# Patient Record
Sex: Female | Born: 1955 | Race: White | Hispanic: No | Marital: Married | Smoking: Current every day smoker
Health system: Southern US, Community
[De-identification: ages and names within clinical notes are randomized; demographics above are authoritative.]

## PROBLEM LIST (undated history)

## (undated) DIAGNOSIS — I10 Essential (primary) hypertension: Secondary | ICD-10-CM

## (undated) DIAGNOSIS — Z862 Personal history of diseases of the blood and blood-forming organs and certain disorders involving the immune mechanism: Secondary | ICD-10-CM

## (undated) DIAGNOSIS — I219 Acute myocardial infarction, unspecified: Secondary | ICD-10-CM

## (undated) DIAGNOSIS — F410 Panic disorder [episodic paroxysmal anxiety] without agoraphobia: Secondary | ICD-10-CM

## (undated) DIAGNOSIS — G43909 Migraine, unspecified, not intractable, without status migrainosus: Secondary | ICD-10-CM

## (undated) DIAGNOSIS — I2510 Atherosclerotic heart disease of native coronary artery without angina pectoris: Secondary | ICD-10-CM

## (undated) DIAGNOSIS — Z8709 Personal history of other diseases of the respiratory system: Secondary | ICD-10-CM

## (undated) DIAGNOSIS — E785 Hyperlipidemia, unspecified: Secondary | ICD-10-CM

## (undated) DIAGNOSIS — J449 Chronic obstructive pulmonary disease, unspecified: Secondary | ICD-10-CM

## (undated) DIAGNOSIS — M1990 Unspecified osteoarthritis, unspecified site: Secondary | ICD-10-CM

## (undated) HISTORY — PX: COLONOSCOPY: SHX174

## (undated) HISTORY — DX: Acute myocardial infarction, unspecified: I21.9

## (undated) HISTORY — PX: CARDIAC CATHETERIZATION: SHX172

## (undated) HISTORY — DX: Panic disorder (episodic paroxysmal anxiety): F41.0

## (undated) HISTORY — DX: Hyperlipidemia, unspecified: E78.5

## (undated) HISTORY — PX: HAND SURGERY: SHX662

## (undated) HISTORY — DX: Migraine, unspecified, not intractable, without status migrainosus: G43.909

## (undated) HISTORY — DX: Atherosclerotic heart disease of native coronary artery without angina pectoris: I25.10

## (undated) HISTORY — DX: Essential (primary) hypertension: I10

---

## 2001-09-17 ENCOUNTER — Ambulatory Visit (HOSPITAL_COMMUNITY): Admission: RE | Admit: 2001-09-17 | Discharge: 2001-09-17 | Payer: Self-pay | Admitting: Family Medicine

## 2001-09-17 ENCOUNTER — Encounter: Payer: Self-pay | Admitting: Family Medicine

## 2003-01-25 DIAGNOSIS — I219 Acute myocardial infarction, unspecified: Secondary | ICD-10-CM

## 2003-01-25 HISTORY — PX: CORONARY ARTERY BYPASS GRAFT: SHX141

## 2003-01-25 HISTORY — DX: Acute myocardial infarction, unspecified: I21.9

## 2003-11-20 ENCOUNTER — Ambulatory Visit: Payer: Self-pay | Admitting: Psychiatry

## 2003-11-29 ENCOUNTER — Ambulatory Visit: Payer: Self-pay | Admitting: Cardiology

## 2003-11-29 ENCOUNTER — Inpatient Hospital Stay (HOSPITAL_COMMUNITY): Admission: EM | Admit: 2003-11-29 | Discharge: 2003-12-03 | Payer: Self-pay | Admitting: Cardiology

## 2003-12-01 ENCOUNTER — Encounter: Payer: Self-pay | Admitting: Cardiology

## 2003-12-17 ENCOUNTER — Ambulatory Visit: Payer: Self-pay | Admitting: *Deleted

## 2003-12-23 ENCOUNTER — Inpatient Hospital Stay (HOSPITAL_COMMUNITY): Admission: AD | Admit: 2003-12-23 | Discharge: 2004-01-03 | Payer: Self-pay | Admitting: Cardiology

## 2003-12-23 ENCOUNTER — Encounter: Payer: Self-pay | Admitting: Emergency Medicine

## 2003-12-23 ENCOUNTER — Ambulatory Visit: Payer: Self-pay | Admitting: Cardiology

## 2003-12-26 ENCOUNTER — Encounter: Payer: Self-pay | Admitting: Cardiology

## 2004-01-23 ENCOUNTER — Ambulatory Visit (HOSPITAL_COMMUNITY): Admission: RE | Admit: 2004-01-23 | Discharge: 2004-01-23 | Payer: Self-pay | Admitting: *Deleted

## 2004-01-23 ENCOUNTER — Ambulatory Visit: Payer: Self-pay | Admitting: *Deleted

## 2004-03-24 ENCOUNTER — Ambulatory Visit: Payer: Self-pay | Admitting: *Deleted

## 2004-12-14 ENCOUNTER — Ambulatory Visit: Payer: Self-pay | Admitting: Cardiology

## 2004-12-27 ENCOUNTER — Ambulatory Visit: Payer: Self-pay

## 2005-05-31 ENCOUNTER — Ambulatory Visit: Payer: Self-pay | Admitting: Cardiology

## 2005-06-02 ENCOUNTER — Ambulatory Visit: Payer: Self-pay

## 2005-07-18 ENCOUNTER — Ambulatory Visit: Payer: Self-pay | Admitting: Cardiology

## 2005-10-10 ENCOUNTER — Ambulatory Visit: Payer: Self-pay | Admitting: Cardiology

## 2006-05-16 ENCOUNTER — Ambulatory Visit: Payer: Self-pay | Admitting: Cardiology

## 2006-06-15 ENCOUNTER — Ambulatory Visit: Payer: Self-pay

## 2006-06-30 ENCOUNTER — Ambulatory Visit: Payer: Self-pay | Admitting: Cardiology

## 2006-10-04 ENCOUNTER — Ambulatory Visit: Payer: Self-pay | Admitting: Cardiovascular Disease

## 2008-03-13 ENCOUNTER — Ambulatory Visit: Payer: Self-pay | Admitting: Cardiology

## 2008-03-26 ENCOUNTER — Encounter (INDEPENDENT_AMBULATORY_CARE_PROVIDER_SITE_OTHER): Payer: Self-pay | Admitting: *Deleted

## 2008-03-26 LAB — CONVERTED CEMR LAB
ALT: 10 units/L
AST: 11 units/L
Albumin: 4.4 g/dL
Alkaline Phosphatase: 94 units/L
Bilirubin, Direct: 0.1 mg/dL
Cholesterol: 224 mg/dL
HDL: 63 mg/dL
LDL Cholesterol: 147 mg/dL
Total Protein: 6.7 g/dL
Triglycerides: 68 mg/dL

## 2008-04-03 ENCOUNTER — Encounter (HOSPITAL_COMMUNITY): Admission: RE | Admit: 2008-04-03 | Discharge: 2008-05-03 | Payer: Self-pay | Admitting: Cardiology

## 2008-04-03 ENCOUNTER — Ambulatory Visit: Payer: Self-pay | Admitting: Cardiology

## 2008-04-03 ENCOUNTER — Encounter (INDEPENDENT_AMBULATORY_CARE_PROVIDER_SITE_OTHER): Payer: Self-pay | Admitting: *Deleted

## 2008-04-03 LAB — CONVERTED CEMR LAB
ALT: 10 units/L
AST: 11 units/L
Albumin: 4.4 g/dL
Alkaline Phosphatase: 94 units/L
Bilirubin, Direct: 0.1 mg/dL
Cholesterol: 224 mg/dL
HDL: 63 mg/dL
LDL Cholesterol: 147 mg/dL
Total Protein: 6.7 g/dL
Triglycerides: 68 mg/dL

## 2008-04-04 ENCOUNTER — Ambulatory Visit: Payer: Self-pay | Admitting: Cardiology

## 2008-04-04 ENCOUNTER — Encounter (INDEPENDENT_AMBULATORY_CARE_PROVIDER_SITE_OTHER): Payer: Self-pay

## 2008-04-04 LAB — CONVERTED CEMR LAB
ALT: 10 units/L
AST: 11 units/L
Albumin: 4.4 g/dL
Alkaline Phosphatase: 94 units/L
Bilirubin, Direct: 0.1 mg/dL
Cholesterol: 224 mg/dL
HDL: 63 mg/dL
LDL Cholesterol: 147 mg/dL
Total Protein: 6.7 g/dL
Triglycerides: 68 mg/dL

## 2008-06-27 ENCOUNTER — Telehealth (INDEPENDENT_AMBULATORY_CARE_PROVIDER_SITE_OTHER): Payer: Self-pay | Admitting: *Deleted

## 2008-06-30 ENCOUNTER — Encounter (INDEPENDENT_AMBULATORY_CARE_PROVIDER_SITE_OTHER): Payer: Self-pay | Admitting: *Deleted

## 2008-08-11 ENCOUNTER — Encounter (INDEPENDENT_AMBULATORY_CARE_PROVIDER_SITE_OTHER): Payer: Self-pay | Admitting: *Deleted

## 2008-08-13 ENCOUNTER — Encounter (INDEPENDENT_AMBULATORY_CARE_PROVIDER_SITE_OTHER): Payer: Self-pay | Admitting: *Deleted

## 2008-11-17 ENCOUNTER — Ambulatory Visit: Payer: Self-pay | Admitting: Cardiology

## 2008-11-17 ENCOUNTER — Inpatient Hospital Stay (HOSPITAL_COMMUNITY): Admission: EM | Admit: 2008-11-17 | Discharge: 2008-11-19 | Payer: Self-pay | Admitting: Cardiology

## 2008-11-18 ENCOUNTER — Encounter: Payer: Self-pay | Admitting: Cardiology

## 2008-11-19 ENCOUNTER — Encounter (INDEPENDENT_AMBULATORY_CARE_PROVIDER_SITE_OTHER): Payer: Self-pay

## 2008-11-19 LAB — CONVERTED CEMR LAB
BUN: 8 mg/dL
CO2: 24 meq/L
Calcium: 8.4 mg/dL
Chloride: 107 meq/L
Creatinine, Ser: 0.52 mg/dL
GFR calc non Af Amer: >60 mL/min
Glomerular Filtration Rate, Af Am: >60 mL/min/{1.73_m2}
Glucose, Bld: 87 mg/dL
HCT: 36 %
Hemoglobin: 12.4 g/dL
MCV: 94.1 fL
Platelets: 213 10*3/uL
Potassium: 3.8 meq/L
Sodium: 137 meq/L
WBC: 6.8 10*3/uL

## 2008-11-20 ENCOUNTER — Encounter (INDEPENDENT_AMBULATORY_CARE_PROVIDER_SITE_OTHER): Payer: Self-pay | Admitting: *Deleted

## 2008-11-20 ENCOUNTER — Telehealth (INDEPENDENT_AMBULATORY_CARE_PROVIDER_SITE_OTHER): Payer: Self-pay | Admitting: *Deleted

## 2008-11-27 DIAGNOSIS — F17200 Nicotine dependence, unspecified, uncomplicated: Secondary | ICD-10-CM

## 2008-11-27 DIAGNOSIS — I10 Essential (primary) hypertension: Secondary | ICD-10-CM

## 2008-11-27 DIAGNOSIS — F410 Panic disorder [episodic paroxysmal anxiety] without agoraphobia: Secondary | ICD-10-CM | POA: Insufficient documentation

## 2008-11-27 DIAGNOSIS — M810 Age-related osteoporosis without current pathological fracture: Secondary | ICD-10-CM | POA: Insufficient documentation

## 2008-11-27 DIAGNOSIS — E782 Mixed hyperlipidemia: Secondary | ICD-10-CM | POA: Insufficient documentation

## 2008-12-02 ENCOUNTER — Ambulatory Visit: Payer: Self-pay | Admitting: Cardiology

## 2008-12-02 ENCOUNTER — Encounter (INDEPENDENT_AMBULATORY_CARE_PROVIDER_SITE_OTHER): Payer: Self-pay

## 2009-01-27 ENCOUNTER — Encounter (INDEPENDENT_AMBULATORY_CARE_PROVIDER_SITE_OTHER): Payer: Self-pay | Admitting: *Deleted

## 2009-02-11 ENCOUNTER — Encounter (HOSPITAL_COMMUNITY): Admission: RE | Admit: 2009-02-11 | Discharge: 2009-03-13 | Payer: Self-pay | Admitting: Preventative Medicine

## 2009-03-12 ENCOUNTER — Encounter: Payer: Self-pay | Admitting: Cardiology

## 2009-04-01 ENCOUNTER — Ambulatory Visit: Payer: Self-pay | Admitting: Cardiology

## 2009-04-01 DIAGNOSIS — I2510 Atherosclerotic heart disease of native coronary artery without angina pectoris: Secondary | ICD-10-CM

## 2009-07-01 ENCOUNTER — Ambulatory Visit: Payer: Self-pay | Admitting: Cardiology

## 2009-07-01 DIAGNOSIS — R059 Cough, unspecified: Secondary | ICD-10-CM | POA: Insufficient documentation

## 2009-07-01 DIAGNOSIS — R05 Cough: Secondary | ICD-10-CM | POA: Insufficient documentation

## 2009-12-11 ENCOUNTER — Telehealth: Payer: Self-pay | Admitting: Cardiology

## 2009-12-15 ENCOUNTER — Encounter: Payer: Self-pay | Admitting: Cardiology

## 2010-01-01 ENCOUNTER — Telehealth (INDEPENDENT_AMBULATORY_CARE_PROVIDER_SITE_OTHER): Payer: Self-pay | Admitting: *Deleted

## 2010-01-04 ENCOUNTER — Ambulatory Visit: Payer: Self-pay | Admitting: Cardiology

## 2010-01-27 ENCOUNTER — Ambulatory Visit: Admit: 2010-01-27 | Payer: Self-pay | Admitting: Cardiology

## 2010-02-14 ENCOUNTER — Encounter: Payer: Self-pay | Admitting: Cardiology

## 2010-02-23 NOTE — Progress Notes (Signed)
Summary: med issue  Phone Note Call from Patient   Caller: Patient Call For:  MEDS Summary of Call: [name] called and she needs her ramperil called in for 5 mg per the phone note from Dr.Mcdowell has a appt scheduled for 01/27/2009 also her toprol xl 50 mg daily called in [facility] Initial call taken by: Alexis Goodell,  January 01, 2010 3:22 PM    Prescriptions: TOPROL XL 50 MG XR24H-TAB (METOPROLOL SUCCINATE) take 1 tablet by mouth once daily  #30 x 3   Entered by:   Larita Fife Via LPN   Authorized by:   Loreli Slot, MD, [facility]   Signed by:   Larita Fife Via LPN on 04/54/0981   Method used:   Electronically to        [facility]* (retail)       [address]       [place], [place]  [address]       Ph: [phone]       Fax: [phone]   RxID:   [id number] RAMIPRIL 5 MG CAPS (RAMIPRIL) take 1 tablert by mouth once daily  #30 x 3   Entered by:   Larita Fife Via LPN   Authorized by:   Loreli Slot, MD, [facility]   Signed by:   Larita Fife Via LPN on 36/64/4034   Method used:   Electronically to        [facility]* (retail)       [address]       [place], [place]  [address]       Ph: [phone]       Fax: [phone]   RxID:   [id number]

## 2010-02-23 NOTE — Assessment & Plan Note (Signed)
Summary: f40m   Visit Type:  Follow-up Primary Provider:  Dr. Doreen Beam   History of Present Illness: 55 year old woman presents for followup. History is reviewed in Amanda prior note. [name] unfortunately did not present for her followup lab work as recommended. She reports compliance with her medications. She does continue to smoke cigarettes, although states that she is going to try to quit. She states she spoke with her therapist, and would like to start on Wellbutrin.  [name] denies any anginal chest pain. She has NYHA class II dyspnea on exertion. She does report a cough over Amanda last 2 weeks with "mucous" and also a sharp, pleuritic chest pain. She's had no hemoptysis, fevers, or chills. She has not followed up with her primary care physician as yet.  Clinical Review Panels:  Cardiac Imaging Cardiac Cath Findings        CONCLUSION:   1. Moderate reduction in Amanda left ventricular function with an       inferior wall motion abnormality, but ejection fraction in Amanda 45-       50% range.   2. No evidence of significant aortic regurgitation with an aortic root       shot demonstrating likely occlusion of Amanda diagonal graft.   3. Continued patency of Amanda saphenous vein graft to Amanda distal right       circulation with occlusion of Amanda sequential limb likely to a       posterolateral branch   4. Continued patency of a small-caliber internal mammary to Amanda distal       left anterior descending artery.   5. Continued patency of Amanda saphenous vein graft to Amanda distal       marginal.   6. Occlusion of Amanda saphenous vein graft to Amanda diagonal.      RECOMMENDATIONS:   1. Discontinuation of smoking will be highly recommended.   2. [name] will follow up with Dr. Diona Browner in [place].               Arturo Morton. Riley Kill, MD, [facility]   Electronically Signed            TDS/MEDQ  D:  11/18/2008  T:  11/19/2008  Job:  [id number]      cc:   Jonelle Sidle, MD   Doreen Beam, MD   Salvadore Farber, MD  (11/18/2008)    Preventive Screening-Counseling & Management  Alcohol-Tobacco     Smoking Status: current     Smoking Cessation Counseling: yes     Packs/Day: 1 PPD  Current Medications (verified): 1)  Ramipril 10 Mg Caps (Ramipril) .... Take 1 Tablet Daily 2)  Nitrolingual 0.4 Mg/spray Soln (Nitroglycerin) .... One Spray Under Tongue Every 5 Minutes As Needed For Chest Pain---May Repeat Times Three 3)  Plavix 75 Mg Tabs (Clopidogrel Bisulfate) .[name].. 1 Tablet By Mouth Once A Day 4)  Toprol Xl 50 Mg Xr24h-Tab (Metoprolol Succinate) .... Take 1 Tablet By Mouth Once Daily 5)  Crestor 40 Mg Tabs (Rosuvastatin Calcium) .... Take 1 Tab Daily 6)  Aspir-Low 81 Mg Tbec (Aspirin) .... Take 1 Tab Daily 7)  Meclizine Hcl 25 Mg Tabs (Meclizine Hcl) .... Take 1 Tab Three Times A Day 8)  Xanax 0.5 Mg Tabs (Alprazolam) .... Take 1 Tab Qid 9)  Zetia 10 Mg Tabs (Ezetimibe) .... Take 1 Tab Daily 10)  Flonase 50 Mcg/act Susp (Fluticasone Propionate) .... 2 Sprays/nostril Daily 11)  Wellbutrin Sr 150 Mg Xr12h-Tab (Bupropion Hcl) .Marland KitchenMarland KitchenMarland Kitchen  Take 1 Tablet By Mouth Once Daily X 3 Days Then Take 1 Tablet By Mouth By Mouth Two Times A Day  Allergies (verified): No Known Drug Allergies  Comments:  Nurse/Medical Assistant: [name]'s medication list and allergies were reviewed with [name] and were updated in Amanda Medication and Allergy Lists.  Past History:  Past Medical History: Last updated: 04/01/2009 CAD - DES RCA 2005, multivessel requiring CABG, LVEF 45-50%, occluded SVG ot diagonal and occluded sequential limb of SVG to PL of RCA 10/10 Hyperlipidemia Hypertension Panic attacks Osteoporosis Myocardial Infarction - IMI 2005  Past Surgical History: Last updated: 01/27/2009 CABG 2005 - LIMA to LAD, SVG to first diagonal, SVG to OM, SVG to RCA and PDA Left hand surgery  Social History: Last updated: 11/27/2008 Divorced  Tobacco Use - Yes.  Alcohol Use - no Regular  Exercise - yes Drug Use - no  Social History: Packs/Day:  1 PPD  Review of Systems  [name] denies anorexia, fever, chest pain, syncope, dyspnea on exertion, peripheral edema, hemoptysis, melena, and hematochezia.         Reports a chronic rash on her feet, also cough as above. Otherwise reviewed and negative.  Vital Signs:  [name] profile:   55 year old [name] Height:      63 inches Weight:      125 pounds O2 Sat:      93 % on Room air Pulse rate:   98 / minute BP sitting:   105 / 77  (left arm) Cuff size:   regular  Vitals Entered By: Carlye Grippe (July 01, 2009 1:53 PM)  O2 Flow:  Room air  Physical Exam  Additional Exam:  Normally nourished woman in no acute distress. HEENT: Conjunctiva and lids normal, oropharynx clear. Neck: Supple, no carotid bruits, no thyromegaly. Lungs: Clear to auscultation, diminished more so at left base, nonlabored. No pleural rub. Cardiac: Regular rate and rhythm, no S3 or pericardial rub. Abdomen: Soft, nontender. Extremity: No pitting edema, distal pulses 2+. Musculoskeletal: No kyphosis. Neuropsychiatric: Alert and oriented x3.   EKG  Procedure date:  07/01/2009  Findings:      Normal sinus rhythm at 100 beats per minutes with diffuse nonspecific ST-T wave changes.  Impression & Recommendations:  Problem # 1:  CORONARY ATHEROSCLEROSIS NATIVE CORONARY ARTERY (ICD-414.01)  No active angina on medical therapy. Electrocardiogram shows diffuse, nonspecific ST-T wave changes. Plan to continue medical therapy at this point, with observation in 6 months.  Amanda following medications were removed from Amanda medication list:    Metoprolol Tartrate 25 Mg Tabs (Metoprolol tartrate) .[name]... Take 1 tablet by mouth twice a day Her updated medication list for this problem includes:    Ramipril 10 Mg Caps (Ramipril) .[name]... Take 1 tablet daily    Nitrolingual 0.4 Mg/spray Soln (Nitroglycerin) ..... One spray under tongue every 5 minutes as  needed for chest pain---may repeat times three    Plavix 75 Mg Tabs (Clopidogrel bisulfate) .[name]... 1 tablet by mouth once a day    Toprol Xl 50 Mg Xr24h-tab (Metoprolol succinate) .[name]... Take 1 tablet by mouth once daily    Aspir-low 81 Mg Tbec (Aspirin) .[name]... Take 1 tab daily  Orders: EKG w/ Interpretation (93000)  Problem # 2:  HYPERTENSION (ICD-401.9)  Blood pressure looks good today.  Amanda following medications were removed from Amanda medication list:    Metoprolol Tartrate 25 Mg Tabs (Metoprolol tartrate) .[name]... Take 1 tablet by mouth twice a day Her updated medication  list for this problem includes:    Ramipril 10 Mg Caps (Ramipril) .[name]... Take 1 tablet daily    Toprol Xl 50 Mg Xr24h-tab (Metoprolol succinate) .[name]... Take 1 tablet by mouth once daily    Aspir-low 81 Mg Tbec (Aspirin) .[name]... Take 1 tab daily  Amanda following medications were removed from Amanda medication list:    Metoprolol Tartrate 25 Mg Tabs (Metoprolol tartrate) .[name]... Take 1 tablet by mouth twice a day Her updated medication list for this problem includes:    Ramipril 10 Mg Caps (Ramipril) .[name]... Take 1 tablet daily    Toprol Xl 50 Mg Xr24h-tab (Metoprolol succinate) .[name]... Take 1 tablet by mouth once daily    Aspir-low 81 Mg Tbec (Aspirin) .[name]... Take 1 tab daily  Problem # 3:  TOBACCO ABUSE (ICD-305.1)  We discussed smoking cessation again today. She states that she is motivated to start Wellbutrin. We will begin Wellbutrin SR 150 mg p.o. q.d. for 3 days, increasing to b.i.d. thereafter if tolerated. I hope she will be able to stop smoking completely.  Problem # 4:  HYPERLIPIDEMIA (ICD-272.4)  She did not followup for her lab work as ordered last time. We will again arrange a fasting lipid profile and liver function tests.  Her updated medication list for this problem includes:    Crestor 40 Mg Tabs (Rosuvastatin calcium) .[name]... Take 1 tab daily    Zetia 10 Mg Tabs (Ezetimibe) .[name]... Take 1 tab daily  Orders: T-Lipid  Profile ([address]191-47829) T-Hepatic Function [phone])  Problem # 5:  COUGH (ICD-786.2)  I asked Ms. Allbritton to make sure that she follows up with Dr. Sherril Croon for further examination and possibly a chest x-ray.  Amanda following medications were removed from Amanda medication list:    Metoprolol Tartrate 25 Mg Tabs (Metoprolol tartrate) .[name]... Take 1 tablet by mouth twice a day Her updated medication list for this problem includes:    Ramipril 10 Mg Caps (Ramipril) .[name]... Take 1 tablet daily    Nitrolingual 0.4 Mg/spray Soln (Nitroglycerin) ..... One spray under tongue every 5 minutes as needed for chest pain---may repeat times three    Plavix 75 Mg Tabs (Clopidogrel bisulfate) .[name]... 1 tablet by mouth once a day    Toprol Xl 50 Mg Xr24h-tab (Metoprolol succinate) .[name]... Take 1 tablet by mouth once daily    Aspir-low 81 Mg Tbec (Aspirin) .[name]... Take 1 tab daily  [name] Instructions: 1)  Your physician recommends that you schedule a follow-up appointment in: 6 months 2)  Your physician recommends that you return for lab work in: This week 3)  Your physician has recommended you make Amanda following change in your medication: Start taking Wellbutrin 150mg  1 week before you plan to stop smoking (take 1 tablet by mouth once daily for 3 days then two times a day there after) Prescriptions: WELLBUTRIN SR 150 MG XR12H-TAB (BUPROPION HCL) Take 1 tablet by mouth once daily X 3 days then take 1 tablet by mouth by mouth two times a day  #60 x 5   Entered by:   Larita Fife Via LPN   Authorized by:   Loreli Slot, MD, [facility]   Signed by:   Larita Fife Via LPN on 84/69/6295   Method used:   Electronically to        [facility]* (retail)       [address]       [place], [place]  [address]  Ph: [phone]       Fax: [phone]   RxID:   [id number]

## 2010-02-23 NOTE — Letter (Signed)
Summary: [facility] at [employer]  [address] [place] [address]   Phone: [phone]  Fax: [phone]     December 15, 2009 MRN: [id number]   [facility] [address] [place], [place]  [address]      YOUR LAB WORK IS DUE  December 21, 2009 _________________________________________  Please go to [facility], located across the street from [facility] on the second floor.  Hours are Monday - Friday 7am until 7:30pm         Saturday 8am until 12noon    _X_  DO NOT EAT OR DRINK AFTER MIDNIGHT EVENING PRIOR TO LABWORK  __ YOUR LABWORK IS NOT FASTING --YOU MAY EAT PRIOR TO LABWORK

## 2010-02-23 NOTE — Miscellaneous (Signed)
  Clinical Lists Changes  Medications: Rx of PLAVIX 75 MG TABS (CLOPIDOGREL BISULFATE) 1 tablet by mouth once a day;  #30 x 1;  Signed;  Entered by: Teressa Lower RN;  Authorized by: Loreli Slot, MD, [facility];  Method used: Electronically to [facility] [address], [place], [place], [place]  [address], Ph: [phone], Fax: [phone]    Prescriptions: PLAVIX 75 MG TABS (CLOPIDOGREL BISULFATE) 1 tablet by mouth once a day  #30 x 1   Entered by:   Teressa Lower RN   Authorized by:   Loreli Slot, MD, [facility]   Signed by:   Teressa Lower RN on 03/12/2009   Method used:   Electronically to        [facility]* (retail)       [address]       [place], [place]  [address]       Ph: [phone]       Fax: [phone]   RxID:   [id number]

## 2010-02-23 NOTE — Progress Notes (Signed)
Summary: pt BP running low  Phone Note Call from Patient Call back at Home Phone [phone]   Caller: pt Reason for Call: Talk to Nurse Summary of Call: pt has been having her BP run low and needs to know what to do about taking BP meds. She said she has not takin them for the last three days. Initial call taken by: Faythe Ghee,  December 11, 2009 1:48 PM  Follow-up for Phone Call        S: Pt. c/o low BP B: Pt. takes Ramipril 10mg  and Metoprolol 50mg  by mouth once daily A: Pt. states that she has not taken Ramipril since Monday due to BP running low at 90/60. She states that her BP yesterday was 126/93, today it is 127/94. She wants to know if she should resume Ramipril or if she discontinue. R: Pt. advised that we will call her with Dr. Ival Bible recommendations. Follow-up by: Larita Fife Via LPN,  December 11, 2009 5:10 PM  Additional Follow-up for Phone Call Additional follow up Details #1::        Consider resuming ramipril at 5 mg daily and continue to monitor BP. Additional Follow-up by: Loreli Slot, MD, [facility],  December 15, 2009 9:40 AM     Appended Document: pt BP running low    Phone Note Outgoing Call   Details for Reason: Ramipril Summary of Call: Pt. advised to resume Ramipril at 5mg  daily and to continue monitoring BP. She states she understands instructions given. Initial call taken by: Larita Fife Via LPN,  December 15, 2009 10:37 AM

## 2010-02-23 NOTE — Miscellaneous (Signed)
Summary: labs lipids,liver 04/03/2008  Clinical Lists Changes  Observations: Added new observation of ALBUMIN: 4.4 g/dL (16/10/9602 5:40) Added new observation of PROTEIN, TOT: 6.7 g/dL (98/11/9145 8:29) Added new observation of SGPT (ALT): 10 units/L (03/26/2008 8:15) Added new observation of SGOT (AST): 11 units/L (03/26/2008 8:15) Added new observation of ALK PHOS: 94 units/L (03/26/2008 8:15) Added new observation of BILI DIRECT: 0.1 mg/dL (56/21/3086 5:78) Added new observation of LDL: 147 mg/dL (46/96/2952 8:41) Added new observation of HDL: 63 mg/dL (32/44/0102 7:25) Added new observation of TRIGLYC TOT: 68 mg/dL (36/64/4034 7:42) Added new observation of CHOLESTEROL: 224 mg/dL (59/56/3875 6:43)

## 2010-02-23 NOTE — Assessment & Plan Note (Signed)
Summary: 2 MTH F/U/PT MISSED APPT ON 01/27/09/TG   Visit Type:  Follow-up Primary Provider:  Dr. Doreen Beam   History of Present Illness: 55 year old woman presents for followup visit, having missed her last scheduled appointment. She had a NSTEMI back in October of last year and underwent cardiac catheterization demonstrating graft disease that was best managed medically. She continues to smoke cigarettes, fairly heavily. She has not been completely compliant with her medication use either.  I had a fairly frank discussion with her today regarding the critical importance of smoking cessation, and also underscoring regular use of medications and followup. She has not seen her primary care physician in some time either. We talked about smoking cessation support groups available through the hospital system, and also potential pharmacologic therapy. She is due for a followup lipid profile and liver function tests. She also states she is having trouble remembering taking her evening dose of metoprolol. Her insight into her medical illness and importance of lifestyle modification seems to be fairly limited, despite our discussions.  Current Medications (verified): 1)  Ramipril 10 Mg Caps (Ramipril) .... Take 1 Tablet Daily 2)  Metoprolol Tartrate 25 Mg Tabs (Metoprolol Tartrate) .... Take 1 Tablet By Mouth Twice A Day 3)  Nitrolingual 0.4 Mg/spray Soln (Nitroglycerin) .... One Spray Under Tongue Every 5 Minutes As Needed For Chest Pain---May Repeat Times Three 4)  Plavix 75 Mg Tabs (Clopidogrel Bisulfate) .[name].. 1 Tablet By Mouth Once A Day 5)  Toprol Xl 50 Mg Xr24h-Tab (Metoprolol Succinate) .... Take 1 Tablet By Mouth Once Daily 6)  Crestor 40 Mg Tabs (Rosuvastatin Calcium) .... Take 1 Tab Daily 7)  Aspir-Low 81 Mg Tbec (Aspirin) .... Take 1 Tab Daily 8)  Meclizine Hcl 25 Mg Tabs (Meclizine Hcl) .... Take 1 Tab Three Times A Day 9)  Xanax 0.5 Mg Tabs (Alprazolam) .... Take 1 Tab Qid 10)  Zetia 10 Mg  Tabs (Ezetimibe) .... Take 1 Tab Daily  Allergies (verified): No Known Drug Allergies  Past History:  Past Surgical History: Last updated: 01/27/2009 CABG 2005 - LIMA to LAD, SVG to first diagonal, SVG to OM, SVG to RCA and PDA Left hand surgery  Social History: Last updated: 11/27/2008 Divorced  Tobacco Use - Yes.  Alcohol Use - no Regular Exercise - yes Drug Use - no  Past Medical History: CAD - DES RCA 2005, multivessel requiring CABG, LVEF 45-50%, occluded SVG ot diagonal and occluded sequential limb of SVG to PL of RCA 10/10 Hyperlipidemia Hypertension Panic attacks Osteoporosis Myocardial Infarction - IMI 2005   Review of Systems  The patient denies anorexia, fever, syncope, dyspnea on exertion, peripheral edema, prolonged cough, headaches, hemoptysis, melena, and hematochezia.         She does experience occasional chest discomfort, mainly on the right side with exertion. She has not been in requirement of significant nitroglycerin use. Otherwise reviewed and negative.  Vital Signs:  Patient profile:   55 year old female Weight:      122 pounds Pulse rate:   95 / minute BP sitting:   136 / 85  (right arm)  Vitals Entered By: Dreama Saa, CNA (April 01, 2009 1:26 PM)  Physical Exam  Additional Exam:  Normally nourished woman in no acute distress. HEENT: Conjunctiva and lids normal, oropharynx clear. Neck: Supple, no carotid bruits, no thyromegaly. Lungs: Clear to auscultation, diminished, nonlabored. Cardiac: Regular rate and rhythm, no S3 or pericardial rub. Abdomen: Soft, nontender. Extremity: No pitting edema, distal pulses 2+.  Skin: Warm and dry. Musculoskeletal: No kyphosis. Neuropsychiatric: Alert and oriented x3.   Impression & Recommendations:  Problem # 1:  CORONARY ATHEROSCLEROSIS NATIVE CORONARY ARTERY (ICD-414.01)  Multivessel cardiovascular disease, and subsequently documented bypass graft disease, being managed medically, and with  LVEF approximately 45% to 50%. Ms. Lecompte presents a challenge related to her ongoing tobacco abuse, and noncompliance with regular medical therapy and followup. I discussed all of these issues with her today in detail and recommended lifestyle changes. We will change her Lopressor to Toprol-XL to hopefully improve compliance. We talked about the basic walking regimen as well. A followup visit will be arranged in the next 3 months.  Her updated medication list for this problem includes:    Ramipril 10 Mg Caps (Ramipril) .[name]... Take 1 tablet daily    Metoprolol Tartrate 25 Mg Tabs (Metoprolol tartrate) .[name]... Take 1 tablet by mouth twice a day    Nitrolingual 0.4 Mg/spray Soln (Nitroglycerin) ..... One spray under tongue every 5 minutes as needed for chest pain---may repeat times three    Plavix 75 Mg Tabs (Clopidogrel bisulfate) .[name]... 1 tablet by mouth once a day    Toprol Xl 50 Mg Xr24h-tab (Metoprolol succinate) .[name]... Take 1 tablet by mouth once daily    Aspir-low 81 Mg Tbec (Aspirin) .[name]... Take 1 tab daily  Problem # 2:  TOBACCO ABUSE (ICD-305.1)  It is absolutely critical that Ms. Padget stop smoking. I have discussed this with her again today. We recommended a smoking cessation support group, and I also discussed pharmacologic measures once she is able to pick a quit date. I hope that she will become more motivated to stop smoking.  Problem # 3:  HYPERLIPIDEMIA (ICD-272.4)  Although she has not been completely compliant with her Crestor, she seems to be tolerating the medicine when she does take it. We will arrange followup lipid profile and liver function tests to reassess baseline, and go from there. Ideally her LDL should be around 70.  Her updated medication list for this problem includes:    Crestor 40 Mg Tabs (Rosuvastatin calcium) .[name]... Take 1 tab daily    Zetia 10 Mg Tabs (Ezetimibe) .[name]... Take 1 tab daily  Orders: T-Lipid Profile ([id number]) T-Hepatic Function  [phone])  Patient Instructions: 1)  Your physician recommends that you schedule a follow-up appointment in: 3 months 2)  Your physician recommends that you return for lab work in: This week 3)  Your physician has recommended you make the following change in your medication: Stop taking Lopressor and start taking Toprol XL 50mg  by mouth once daily  4)  Your physician discussed the hazards of tobacco use.  Tobacco use cessation is recommended and techniques and options to help you quit were discussed. Prescriptions: TOPROL XL 50 MG XR24H-TAB (METOPROLOL SUCCINATE) take 1 tablet by mouth once daily  #30 x 3   Entered by:   Larita Fife Via LPN   Authorized by:   Loreli Slot, MD, [facility]   Signed by:   Larita Fife Via LPN on 96/29/5284   Method used:   Electronically to        [facility]* (retail)       [address]       [place], [place]  [address]       Ph: [phone]       Fax: [phone]   RxID:   [id number]

## 2010-03-04 ENCOUNTER — Encounter: Payer: Self-pay | Admitting: Adult Health

## 2010-03-04 ENCOUNTER — Ambulatory Visit (INDEPENDENT_AMBULATORY_CARE_PROVIDER_SITE_OTHER): Payer: BC Managed Care – PPO | Admitting: Cardiology

## 2010-03-04 ENCOUNTER — Encounter (INDEPENDENT_AMBULATORY_CARE_PROVIDER_SITE_OTHER): Payer: Self-pay | Admitting: *Deleted

## 2010-03-04 DIAGNOSIS — I10 Essential (primary) hypertension: Secondary | ICD-10-CM

## 2010-03-04 DIAGNOSIS — E785 Hyperlipidemia, unspecified: Secondary | ICD-10-CM

## 2010-03-04 DIAGNOSIS — I2510 Atherosclerotic heart disease of native coronary artery without angina pectoris: Secondary | ICD-10-CM

## 2010-03-11 NOTE — Letter (Signed)
Summary: [place] [employer] [facility] [employer], [employer] at [employer]  [address] [place] [address]   Phone: [phone]  Fax: [phone]     March 04, 2010 MRN: [id number]   [facility] [address] [place], [place]  [address]      YOUR LAB WORK IS DUE   March 08, 2010  Please go to [facility], located across the street from [facility] on the second floor.  Hours are Monday - Friday 7am until 7:30pm         Saturday 8am until 12noon    _x_  DO NOT EAT OR DRINK AFTER MIDNIGHT EVENING PRIOR TO LABWORK

## 2010-03-11 NOTE — Assessment & Plan Note (Signed)
Summary: rov needs meds refilled/sn   Visit Type:  Follow-up Primary Provider:  Dr. Doreen Beam   History of Present Illness: [name] is a 55 y/o CF with known history of CAD, s/p CABG (LIMA-LAD, SVG to distal RCA adn PDA, SVG to distal marginal, and SVG to diagonal), stent to the RCA prior to CABG; ongoing tobacco abuse, hypertension, hypercholesterolemia, and psychological issues for which she is currently seeing a therapist.  She admitts to medical noncompliance and does not take her medications as directed.  She states that she is under a lot of stress at work and home and either doesn't feel like taking meds or forgets to take them.  She is aware and expresses the importance of taking her medications as directed and to quit smoking but just cannot bring herself to do so on a regular or commited basis.    She states that she is having some trouble walking across a parking lot at work and does not feel as if she is in shape to go to the gym or exercise. Smoking has increased and she has recently been placed on wellbutrin for depression and smoking cessation by her primary care physician.  She has not had labs drawn for several months and is uncertain  of her cholesterol status. She admits to excessive caffine use each day.  Current Medications (verified): 1)  Ramipril 5 Mg Caps (Ramipril) .... Take 1 Tablert By Mouth Once Daily 2)  Nitrolingual 0.4 Mg/spray Soln (Nitroglycerin) .... One Spray Under Tongue Every 5 Minutes As Needed For Chest Pain---May Repeat Times Three 3)  Plavix 75 Mg Tabs (Clopidogrel Bisulfate) .[name].. 1 Tablet By Mouth Once A Day 4)  Toprol Xl 50 Mg Xr24h-Tab (Metoprolol Succinate) .... Take 1 Tablet By Mouth Once Daily 5)  Aspir-Low 81 Mg Tbec (Aspirin) .... Take 1 Tab Daily 6)  Xanax 0.5 Mg Tabs (Alprazolam) .... Take 1 Tab Qid 7)  Flonase 50 Mcg/act Susp (Fluticasone Propionate) .... 2 Sprays/nostril Daily 8)  Zetia 10 Mg Tabs (Ezetimibe) .... Take One Tablet By Mouth  Daily. 9)  Crestor 40 Mg Tabs (Rosuvastatin Calcium) .... Take One Tablet By Mouth Daily.  Allergies (verified): No Known Drug Allergies  Comments:  Nurse/Medical Assistant: patient brought med list she is not taking zetia or crestor laynes in [place]  Past History:  Social History: Last updated: 11/27/2008 Divorced  Tobacco Use - Yes.  Alcohol Use - no Regular Exercise - yes Drug Use - no  Past medical, surgical, family and social histories (including risk factors) reviewed, and no changes noted (except as noted below).  Past Medical History: Reviewed history from 04/01/2009 and no changes required. CAD - DES RCA 2005, multivessel requiring CABG, LVEF 45-50%, occluded SVG ot diagonal and occluded sequential limb of SVG to PL of RCA 10/10 Hyperlipidemia Hypertension Panic attacks Osteoporosis Myocardial Infarction - IMI 2005  Past Surgical History: Reviewed history from 01/27/2009 and no changes required. CABG 2005 - LIMA to LAD, SVG to first diagonal, SVG to OM, SVG to RCA and PDA Left hand surgery  Family History: Reviewed history from 01/27/2009 and no changes required. Family History of Coronary Artery Disease  Social History: Reviewed history from 11/27/2008 and no changes required. Divorced  Tobacco Use - Yes.  Alcohol Use - no Regular Exercise - yes Drug Use - no  Review of Systems       All other systems have been reviewed and are negative unless stated above.   Vital Signs:  Patient  profile:   55 year old female Weight:      121 pounds BMI:     21.51 Pulse rate:   95 / minute BP sitting:   125 / 85  (left arm)  Vitals Entered By: Dreama Saa, CNA (March 04, 2010 2:23 PM)  Physical Exam  General:  normal appearance.  normal appearance.   Head:  normocephalic and atraumatic Eyes:  PERRLA/EOM intact; conjunctiva and lids normal. Lungs:  Clear bilaterally to auscultation and percussion. Heart:  Tachycardic, RRR without MRG.  Pulses are  palpable, no carotid bruits, no LEE. Abdomen:  Bowel sounds positive; abdomen soft and non-tender without masses, organomegaly, or hernias noted. No hepatosplenomegaly. Msk:  Back normal, normal gait. Muscle strength and tone normal. Pulses:  pulses normal in all 4 extremities Extremities:  No clubbing or cyanosis. Neurologic:  Alert and oriented x 3. Psych:  hyperactive.  hyperactive.     Impression & Recommendations:  Problem # 1:  CORONARY ATHEROSCLEROSIS NATIVE CORONARY ARTERY (ICD-414.01) I do not believe that her DOE is related to CAD at this time. She is increasing her tobacco use and has frequent coughing.  I have advised her to see PMD and have PFT;s completed.  We can certainly plan to have her repeat a stress myoview if she has chest pain.  Most recent cath in 2010 showed continued patency of all grafts with the exception of of the SCG to the diagonal.  She is to be continued on medical management.  Refills of all cardiac medications are provided. Her updated medication list for this problem includes:    Ramipril 5 Mg Caps (Ramipril) .[name]... Take 1 tablert by mouth once daily    Nitrolingual 0.4 Mg/spray Soln (Nitroglycerin) ..... One spray under tongue every 5 minutes as needed for chest pain---may repeat times three    Plavix 75 Mg Tabs (Clopidogrel bisulfate) .[name]... 1 tablet by mouth once a day    Toprol Xl 50 Mg Xr24h-tab (Metoprolol succinate) .[name]... Take 1 tablet by mouth once daily    Aspir-low 81 Mg Tbec (Aspirin) .[name]... Take 1 tab daily  Problem # 2:  TOBACCO ABUSE (ICD-305.1) Lengthyy discussion of smoking cessation is again completed.  She is willing to try again and willing take the Wellbutrin to assist withj this.  Problem # 3:  HYPERLIPIDEMIA (ICD-272.4) Repeat lipids and LFT's.  Rx for crestor and for zetia along with samples are provided. The following medications were removed from the medication list:    Crestor 40 Mg Tabs (Rosuvastatin calcium) .[name]... Take 1 tab  daily    Zetia 10 Mg Tabs (Ezetimibe) .[name]... Take 1 tab daily Her updated medication list for this problem includes:    Zetia 10 Mg Tabs (Ezetimibe) .[name]... Take one tablet by mouth daily.    Crestor 40 Mg Tabs (Rosuvastatin calcium) .[name]... Take one tablet by mouth daily.  Future Orders: T-Lipid Profile [id number]) ... 03/08/2010 T-Basic Metabolic Panel [id number]) ... 03/08/2010 T-TSH [id number]) ... 03/08/2010 T-Hepatic Function [id number]) ... 03/08/2010  Patient Instructions: 1)  Your physician recommends that you schedule a follow-up appointment in: 6 months 2)  Your physician recommends that you return for lab work in: next week 3)  Your physician has recommended you make the following change in your medication:  crestor 40mg  daily 4)  zetia 10mg  daily Prescriptions: CRESTOR 40 MG TABS (ROSUVASTATIN CALCIUM) Take one tablet by mouth daily.  #30 x 10   Entered by:   Teressa Lower RN   Authorized by:  Joni Reining, NP   Signed by:   Teressa Lower RN on 03/04/2010   Method used:   Electronically to        [facility]* (retail)       [address]       [place], [place]  [address]       Ph: [phone]       Fax: [phone]   RxID:   [id number] ZETIA 10 MG TABS (EZETIMIBE) Take one tablet by mouth daily.  #30 x 10   Entered by:   Teressa Lower RN   Authorized by:   Joni Reining, NP   Signed by:   Teressa Lower RN on 03/04/2010   Method used:   Electronically to        [facility]* (retail)       [address]       [place], [place]  [address]       Ph: [phone]       Fax: [phone]   RxID:   [id number]

## 2010-04-29 LAB — CBC
HCT: 36 % (ref 36.0–46.0)
HCT: 40.2 % (ref 36.0–46.0)
Hemoglobin: 12.4 g/dL (ref 12.0–15.0)
Hemoglobin: 13.8 g/dL (ref 12.0–15.0)
MCHC: 34.2 g/dL (ref 30.0–36.0)
MCHC: 34.6 g/dL (ref 30.0–36.0)
MCV: 94.1 fL (ref 78.0–100.0)
MCV: 94.2 fL (ref 78.0–100.0)
Platelets: 213 10*3/uL (ref 150–400)
Platelets: 260 10*3/uL (ref 150–400)
RBC: 3.82 MIL/uL — ABNORMAL LOW (ref 3.87–5.11)
RBC: 4.27 MIL/uL (ref 3.87–5.11)
RDW: 13.1 % (ref 11.5–15.5)
RDW: 13.5 % (ref 11.5–15.5)
WBC: 5.5 10*3/uL (ref 4.0–10.5)
WBC: 6.8 10*3/uL (ref 4.0–10.5)

## 2010-04-29 LAB — BASIC METABOLIC PANEL
BUN: 8 mg/dL (ref 6–23)
BUN: 9 mg/dL (ref 6–23)
CO2: 24 mEq/L (ref 19–32)
CO2: 31 mEq/L (ref 19–32)
Calcium: 8.4 mg/dL (ref 8.4–10.5)
Calcium: 9 mg/dL (ref 8.4–10.5)
Chloride: 104 mEq/L (ref 96–112)
Chloride: 107 mEq/L (ref 96–112)
Creatinine, Ser: 0.52 mg/dL (ref 0.4–1.2)
Creatinine, Ser: 0.64 mg/dL (ref 0.4–1.2)
GFR calc Af Amer: >60 mL/min (ref >60)
GFR calc Af Amer: >60 mL/min (ref >60)
GFR calc non Af Amer: >60 mL/min (ref >60)
GFR calc non Af Amer: >60 mL/min (ref >60)
Glucose, Bld: 87 mg/dL (ref 70–99)
Glucose, Bld: 91 mg/dL (ref 70–99)
Potassium: 3.8 mEq/L (ref 3.5–5.1)
Potassium: 4.3 mEq/L (ref 3.5–5.1)
Sodium: 137 mEq/L (ref 135–145)
Sodium: 140 mEq/L (ref 135–145)

## 2010-04-29 LAB — PROTIME-INR
INR: 1 (ref 0.00–1.49)
Prothrombin Time: 13.1 seconds (ref 11.6–15.2)

## 2010-06-08 NOTE — Assessment & Plan Note (Signed)
[facility]                            CARDIOLOGY OFFICE NOTE   NAME:Washko, SHONTA BOURQUE                      MRN:          [id number]  DATE:06/30/2006                            DOB:          1955/07/28    HISTORY OF PRESENT ILLNESS:  [name] is a 56 year old woman who  suffered an inferior myocardial infarction in November 2005.  I placed a  drug-eluting stent in her mid right coronary artery.  She subsequently  underwent coronary artery bypass grafting by Dr. Laneta Simmers for multivessel  disease.  Her ejection fraction is preserved.  She continues to smoke.   After she presented with some atypical chest pain, we obtained a stress  test 3 weeks ago that showed no evidence of ischemia.  It also did not  show her prior infarct.  EF was 60%.  Since then, she has been doing  quite nicely.  She has not had any recurrent chest discomfort at all  similar to her prior angina.  She does have some discomfort near the  xiphoid process with motion.  This is quite different than her prior  angina.  She does not have any exertional dyspnea.   CURRENT MEDICATIONS:  1. Aspirin 81 mg daily.  2. Xanax 4-5 mg daily.  3. Toprol XL 50 mg daily.  4. Plavix 75 mg daily.  5. Altace 5 mg daily.  6. She is out of her Lipitor and Zetia.   PHYSICAL EXAMINATION:  GENERAL:  She is generally well-appearing in no  distress.  VITAL SIGNS:  Heart rate 62, blood pressure 130/90, weight 122 pounds.  Weight is stable.  NECK:  She has no jugular venous distention, thyromegaly, or  lymphadenopathy.  LUNGS:  Clear to auscultation.  Her respiratory effort is normal.  CARDIAC:  She has a nondisplaced point of maximal cardiac impulse.  There is a regular rate and rhythm without murmur, rub, or gallop.  ABDOMEN:  Soft, nondistended, nontender.  There is no  hepatosplenomegaly.  Bowel sounds are normal.  EXTREMITIES:  Warm without edema.   IMPRESSION/RECOMMENDATIONS:  1. Coronary  disease:  Doing well after inferior myocardial infarction      and CABG in 2005.  Drug-eluting stent in the right coronary artery.      Continue aspirin and Plavix indefinitely.  2. Hypercholesterolemia:  Cannot swallow the whole Lipitor but is able      to crush it.  Continue Lipitor and Zetia.  Encouraged compliance.  3. Tobacco abuse:  I again strongly advised her on the importance of      complete cessation.  She tells me again she will fill her Chantix      prescription today.  Her husband does seem committed to quitting.      I strongly encouraged them both to do so.   She will follow up with Dr. [name] Emms in our [place] office as I will be  leaving.     Salvadore Farber, MD  Electronically Signed    WED/MedQ  DD: 06/30/2006  DT: 06/30/2006  Job #: [id number]

## 2010-06-08 NOTE — Assessment & Plan Note (Signed)
[facility]                        CARDIOLOGY OFFICE NOTE   NAME:[name]                      MRN:          [id number]  DATE:03/13/2008                            DOB:          [birth year]    PRIMARY CARE PHYSICIAN:  Doreen Beam, MD   REASON FOR VISIT:  Scheduled followup.   HISTORY OF PRESENT ILLNESS:  This is my first meeting with [name].  She has been followed in the past by Dr. Samule Ohm and subsequently Dr.  [name] Emms, last seen in our office back in September 2008.  She has a  history of inferior wall myocardial infarction status post placement of  2 drug-eluting stents in the right coronary artery back in 2005 with  residual left system disease, ultimately requiring coronary artery  bypass grafting with a LIMA to the left anterior descending, saphenous  vein graft to the first diagonal, saphenous vein graft to the obtuse  marginal, and sequential saphenous vein graft to the distal right  coronary artery and posterior descending in December 2005 by Dr. Laneta Simmers.  She has been managed medically since that time.  In reviewing her  history, [name] has a long-standing history of tobacco abuse and  continues to smoke cigarettes.  She also has had difficulty being  compliant with her lipid therapy.  She has not been taking Zetia or  Lipitor regularly.  I reviewed her baseline medicines today which are  outlined below.  She has not been on aspirin regularly either.  In terms  of symptoms, she has had some shortness of breath with activity at NYHA  class II, somewhat progressive over the last year.  Over the last 4-5  months, she has also had some left arm and chest discomfort.  It has  been sporadic.  She has become more concerned about her symptoms and  scheduled an office visit today.   Parenthetically, [name] also describes herself as anxious and  reports panic attacks and stress bringing on some of her symptoms.  She  has  not had a followup ischemic evaluation, although a follow-up Myoview  was recommended by Dr. [name] Emms back in 2008.  Today, I had a fairly frank  discussion with her regarding smoking cessation and also being compliant  with her medical therapy.  She has not had a recent lipid profile.   ALLERGIES:  VALIUM.   PRESENT MEDICATIONS:  1. Plavix 75 mg p.o. daily.  2. Metoprolol 25 mg p.o. b.i.d.  3. Ramipril 10 mg p.o. daily.  4. Sublingual nitroglycerin 0.4 mg p.r.n.  5. Xanax 1 mg as directed p.r.n.  6. Flonase and meclizine p.r.n.   REVIEW OF SYSTEMS:  As outlined above.  Otherwise, negative.   PHYSICAL EXAMINATION:  VITAL SIGNS:  Blood pressure 100/80, heart rate  is 81, weight is 123 pounds.  GENERAL:  This is a normally nourished-appearing woman in no acute  distress.  HEENT:  Conjunctiva is normal.  Pharynx clear.  NECK:  Supple.  No elevated jugular venous pressure.  No loud bruits.  No thyromegaly is noted.  LUNGS:  Clear without labored breathing at  rest.  CHEST:  Regular rate and rhythm.  No pathologic systolic murmur or S3  gallop.  No pericardial rub.  ABDOMEN:  Soft, nontender.  No bruits.  Normoactive bowel sounds  EXTREMITIES:  No pitting edema.  Distal pulses are 2+.  SKIN:  Warm and dry.  MUSCULOSKELETAL:  No kyphosis noted.  NEUROPSYCHIATRIC:  The patient is alert and oriented x3.   IMPRESSION AND RECOMMENDATIONS:  History of multivessel coronary artery  disease status post previous inferior wall myocardial infarction managed  initially with drug-eluting stent placement x2 to the right coronary  artery followed subsequently by coronary artery bypass grafting as  detailed above.  Ejection fraction has been normal.  She has not had a  recent ischemic follow-up over the last 2 years.  She is reporting  symptoms of somewhat progressive dyspnea on exertion and also  intermittent chest and left arm discomfort.  Complicating these symptoms  are known anxiety and panic  attacks.  Her electrocardiogram today is  essentially normal.  Our plan will be to resume aspirin 81 mg daily  along with her Plavix, provide a refill for sublingual nitroglycerin,  and check a fasting lipid profile with a plan to consider a different  statin preparation to see if she tolerates this better.  We talked about  smoking cessation and its critical importance for her in terms of long-  term prognosis and graft patency.  I have also recommended a followup  exercise Myoview on medical therapy to assess ischemic burden.  I will  plan to have her follow up in the office over the next 3 weeks to review  these tests.     Jonelle Sidle, MD  Electronically Signed    SGM/MedQ  DD: 03/13/2008  DT: 03/14/2008  Job #: [id number]   cc:   Doreen Beam, MD

## 2010-06-08 NOTE — Assessment & Plan Note (Signed)
[name]                       [name] NOTE   NAME:[name]                      MRN:          [id number]  DATE:04/04/2008                            DOB:          11/27/1955    PRIMARY CARE PHYSICIAN:  Dr. Doreen Beam, MD   REASON FOR VISIT:  Routine followup.   HISTORY OF PRESENT ILLNESS:  [name] was seen in the [name] back in  February.  Please see my previous note for details.  We made some  medical therapy adjustments and then send off liver function and lipid  testing which is presently pending.  She did have followup ischemic  evaluation via an exercise Myoview that revealed no diagnostic  electrocardiographic changes and no perfusion evidence of ischemia with  a normal left ventricular ejection fraction of 66% and normal wall  motion.  She follows up to review these studies.  She is not having any  anginal chest pain.  We again discussed smoking cessation today.  Blood  pressure and heart rate look stable, and she is tolerating her  medications which are outlined below.   ALLERGIES:  VALIUM.   PRESENT MEDICATIONS:  1. Aspirin 81 mg p.o. daily.  2. Plavix 75 mg p.o. daily.  3. Metoprolol 25 mg p.o. b.i.d.  4. Ramipril 10 mg p.o. daily.  5. Boniva 1 daily.  6. Sublingual nitroglycerin 0.4 mg p.r.n.  7. Xanax 1 mg p.o. p.r.n.  8. Flonase p.r.n.   REVIEW OF SYSTEMS:  As outlined above.  She states she was diagnosed  with osteoporosis and has now been placed on Boniva.  Otherwise  negative.   PHYSICAL EXAMINATION:  VITAL SIGNS:  Blood pressure 110/82, heart rate  is 78, weight is 123 pounds.  GENERAL:  The patient is in no acute distress.  HEENT:  Conjunctivae normal.  Oropharynx clear.  NECK:  Supple.  No elevated jugular venous pressure.  No bruits.  No  thyromegaly.  LUNGS:  Clear without labored breathing.  CARDIAC:  Regular rate and rhythm.  No pathologic systolic murmur or S3  gallop.  ABDOMEN:  Soft, nontender.  Normoactive bowel sounds.  EXTREMITIES:  No significant pitting edema.  Distal pulses are 2+.  SKIN:  Warm and dry.  MUSCULOSKELETAL:  No kyphosis noted.  NEUROPSYCHIATRIC:  The patient is alert and oriented x3.  Affect is  appropriate.   IMPRESSION/RECOMMENDATIONS:  1. Multivessel cardiovascular disease, status post previous inferior      wall myocardial infarction, ultimately status post bypass grafting      with a LIMA to left anterior descending, saphenous vein graft to      the first diagonal, saphenous vein graft to the obtuse marginal,      and saphenous vein graft to the distal right coronary artery, and      posterior descending in 2005.  Recent follow up testing shows      normal ventricular systolic function and no active ischemia.  I      anticipate continued medical therapy and we will see her back over      the next 6 months.  2. History of hyperlipidemia with previous difficulty tolerating      Lipitor due to trouble swallowing the pills.  A follow up lipid      profile is pending and based on these numbers, we can make      recommendation about a different statin preparation.     Amanda Sidle, MD  Electronically Signed    SGM/MedQ  DD: 04/04/2008  DT: 04/05/2008  Job #: [id number]   cc:   Doreen Beam, MD

## 2010-06-08 NOTE — Assessment & Plan Note (Signed)
[facility]                       [facility] OFF[facility] NOTE   NAME:[name], [name]                      MRN:          [id number]  DATE:10/04/2006                            DOB:          10-Jul-1955    [name] is seen today as a new patient by me.  He has previously been  seen by Dr. Samule Ohm.  The patient has had a previous inferior wall MI.  Apparently, she presented to [facility] after having prolonged  episode of left arm pit pain.  The patient was subsequently transferred  to [facility].  She had stenting of the right coronary artery and  subsequently had bypass for three vessel disease.   The patient has been doing fairly well.  She is an anxious person.  Her  risk factors have not been well-modified.  She continues to smoke about  a pack a day.  She is not taking her cholesterol medicine.  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   The patient initially said that she would not take her cholesterol pills  because they were too big.  I told her that we could try to find a pill  that had a smaller form to it.  She was suppose to be taking both  Lipitor and Zetia.  I would settle for her taking a statin drug at this  time.  The patient is fairly active.  She likes to dance.  She is not  having any significant chest pain.  She is having some atypical symptoms  that sound more like paresthesias in the left side of her chest.  She  attributes it to the vein they removed from me.  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  She seems to understand this better.   From a cardiac perspective, she is otherwise doing well.  There has been  no palpitations, syncope, PND or orthopnea.  She has  been compliant with  her other medications except for her cholesterol pills.   REVIEW OF SYSTEMS:  Otherwise negative.   CURRENT MEDICATIONS:  1. Aspirin one a day.  2. Plavix 75 mg a day.  3. Metoprolol 50 mg a day.  4. Altace 10 a day.  5. Lipitor, not compliant.  6. Zetia, not compliant.  7. Xanax p.r.n.   PHYSICAL EXAMINATION:  GENERAL:  A healthy-appearing, middle-aged, white  female with somewhat histrionic affect.  VITAL SIGNS:  Weight 122, blood pressure 120/80, respirations 14, pulse  76 and regular, afebrile.  HEENT:  Normal.  NECK:  Carotids normal without bruits.  No lymphadenopathy, no  thyromegaly, no JVP elevation.  LUNGS:  Clear with good diaphragmatic motion.  No wheezing.  HEART:  There is an S1, S2, normal heart sounds.  PMI normal.  ABDOMEN:  Benign.  Bowel sounds are positive.  No abdominal aortic  aneurysm.  No hepatosplenomegaly.  No jugular reflux.  No tenderness.  No bruits.  EXTREMITIES:  Femorals are +3 bilaterally without bruits.  PTs are +2.  There is no lower extremity edema.  She has a tattoo on her right leg.   Her baseline EKG shows sinus rhythm with previous inferior wall MI.   IMPRESSION:  1. Coronary disease with previous stent to the right coronary artery      and residual coronary artery bypass graft.  Continue aspirin, beta-      blocker and Plavix.  She is not having chest pain.  Followup      Myoview in 1 year, particularly given poor risk factor management.  2. Smoking.  I had a long discussion with [name].  She understands      that her smoking is a very bad risk factor for her.  She has a      prescription for Chantix that she has not filled.  She will start      to take this.  Her husband, who is with her today, does not smoke      and this should be helpful.  3. Hypercholesterolemia.  Trial of Lipitor 40 a day.  Just to make      sure she is compliant somewhat, followup lipid and liver profile in      6 months.  4. Some  problems with her left ankle falling asleep.  I suspect that      this has to do with the fact that she sits in [place] position      quite frequently.  There is no evidence of vascular disease.  I      explained to her also that keeping the foot warm and stopping her      nicotine would help in regards to preventing vasospasm of the      distal arterials.  She does not need ankle brachial indexes at this      time.   PLAN:  I will see her back in 6 months and arrange for her stress test.     Theron Arista C. [name] Emms, MD, [facility]  Electronically Signed    PCN/MedQ  DD: 10/04/2006  DT: 10/05/2006  Job #: [id number]

## 2010-06-11 NOTE — Assessment & Plan Note (Signed)
[facility]                              CARDIOLOGY OFFICE NOTE   NAME:[name], [name]                      MRN:          [id number]  DATE:10/10/2005                            DOB:          Oct 10, 1955    HISTORY OF PRESENT ILLNESS:  [name] is a 55 year old lady who suffered  an inferior myocardial infarction in November 2005.  I placed a drug-eluting  stent in her mid-right coronary artery.  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  Ejection  fraction is preserved.  She continues to smoke and troubled by her anxiety  disorder.   She was hospitalized overnight at [facility] 10 days ago after  presenting with a stabbing left breast discomfort.  Electrocardiogram was  unchanged from previous.  She ruled out for myocardial infarction by serial  enzymes and electrocardiograms.  A single troponin was 0.04, which is  slightly above the upper limit of normal of 0.03.  She has had no recurrent  chest discomfort.  She has no exertional dyspnea, PND, orthopnea, or edema.  She thinks her symptoms were due to a panic attack.  She says her anxiety  disorder has been much worse over the past couple of months, and she has  been seeing her psychologist for this.   CURRENT MEDICATIONS:  1. Aspirin 81 mg per day.  2. Xanax 1 mg up to 5 times per day.  3. Toprol-XL 50 mg per day.  4. Plavix 75 mg per day.  5. Zetia 10 mg per day.  6. Lipitor 80 mg, taking very irregularly.  7. Flonase 50 mcg per day.  8. Altace 5 mg per day.   PHYSICAL EXAMINATION:  GENERAL:  She is generally well appearing, in no  distress.  VITAL SIGNS:  Heart rate 100, blood pressure 130/80, weight 116 pounds.  Weight is down 4 pounds from May 2007.  NECK:  She has no jugular venous distention, no thyromegaly.  LUNGS:  Clear to auscultation.  CARDIAC:  She has a nondisplaced point of maximal cardiac impulse.  There is  a regular rate and rhythm, without murmurs, rubs, or gallops.  Median  sternotomy is well healed.  ABDOMEN:  Soft, nondistended, nontender.  There is no hepatosplenomegaly.  Bowel sounds are normal.  No pulsatile midline mass, and no abdominal bruit.  EXTREMITIES:  Warm, without clubbing, cyanosis, edema, or ulceration.  PULSES:  Carotid pulses 2+ bilaterally, without bruit.  PT pulse 2+  bilaterally.  NEUROLOGIC:  Alert and oriented x3, with normal neurologic exam and normal  affect.   Electrocardiogram demonstrates normal sinus rhythm with nonspecific ST-T  abnormalities.   IMPRESSION/RECOMMENDATIONS:  1. Coronary disease.  Doing well after coronary artery bypass grafting.      EF preserved.  Recent chest pain appears to have been noncardiac.  2. Negative stress test this year.  Will not repeat.  Continue aspirin,      Plavix, beta blocker, and ACE inhibitor.  Continue Plavix indefinitely,      given drug-eluting stent in the right coronary.  3.  Hypercholesterolemia.  She has been crushing her Lipitor.  However, she      has been taking it irregularly.  I have asked her to take it on a      regular basis, and then will recheck lipids and LFTs in 12 weeks.  4. Tobacco abuse.  Again, strongly advised her on the importance of      complete cessation.  She says she will fill her Chantix prescription      today.  Encouraged her husband to quit as well.   I will plan on seeing her back in 6 months.  Will check lipids and LFTs in 3  months.                                 Salvadore Farber, MD    WED/MedQ  DD:  10/10/2005  DT:  10/11/2005  Job #:  [id number]   cc:   Doreen Beam

## 2010-06-11 NOTE — Cardiovascular Report (Signed)
[name], [name]               ACCOUNT NO.:  [id number]   MEDICAL RECORD NO.:  [id number]          PATIENT TYPE:  INP   LOCATION:  6529                         FACILITY:  [facility]   PHYSICIAN:  Carole Binning, M.D. LHCDATE OF BIRTH:  Dec 20, 1955   DATE OF PROCEDURE:  12/25/2003  DATE OF DISCHARGE:                              CARDIAC CATHETERIZATION   PROCEDURE PERFORMED:  Left heart catheterization with coronary angiography  and left ventriculography.   INDICATIONS:  [name] is a 55 year old woman who experienced an acute  inferior wall myocardial infarction approximately one month ago.  She  underwent placement of a drug-eluting stent in the mid right coronary artery  at that time.  She had residual significant disease in the left anterior  descending artery.  After review the patient underwent repeat  catheterization three days later and did confirm significant disease in the  left coronary arteries.  Dr. Melinda Crutch plan was to refer the patient for  elective coronary artery bypass surgery.  She represented with symptoms of  recurrent chest pain worrisome for unstable angina.  She has been admitted  and plans are to proceed with coronary artery bypass surgery in the next few  days.  She was referred for repeat catheterization to rule out any  significant change in her anatomy.   PROCEDURAL NOTE:  A 5-French sheath was placed in the right femoral artery.  The left coronary artery was imaged with a 5-French JL3.5 catheter.  The  right coronary artery was imaged with a 5-French JR4 catheter.  Left  ventriculography was performed with an angled pigtail catheter.  Contrast  was Omnipaque.  There were no complications.   RESULTS:  HEMODYNAMICS:  Left ventricular pressure 120/18.  Aortic pressure 135/72.  There is no  aortic valve gradient.   LEFT VENTRICULOGRAM:  There is mild to moderate akinesis of the inferior apical wall.  Ejection  fraction is estimated at 50%.  There is  2+ mild mitral regurgitation.   CORONARY ARTERIOGRAPHY:  Left main has an ostial 60% stenosis.   Left anterior descending artery has a tubular 80% stenosis in the proximal  vessel followed by a 70% stenosis in the mid vessel followed by an 80%  stenosis in the distal vessel.  The LAD gives rise to two small diagonal  branches.   Left circumflex has a 30% stenosis in the proximal vessel and a 50% stenosis  in the mid vessel.  The circumflex gives rise to a single large bifurcating  obtuse marginal branch.   Right coronary artery is a dominant vessel.  There is a 40% stenosis in the  proximal vessel.  There is a stent in the mid vessel which is widely patent  with 0% stenosis within the stent.  Beyond the stent in the distal vessel  there is a 40% stenosis.  The distal right coronary artery gives rise to a  large posterior descending artery and a small posterolateral branch.  There  is a 70% stenosis in the mid portion of the posterior descending artery.   IMPRESSION:  1.  Mildly decreased left ventricular systolic  function.  2.  Patent stent in the right coronary artery.  However, there is residual      three vessel coronary artery disease as described with moderate disease      in the right coronary artery and significant disease in the left      anterior descending artery as well as in the ostium of the left main      coronary artery.   PLAN:  Proceed with coronary artery bypass surgery as outlined.      Mark   MWP/MEDQ  D:  12/25/2003  T:  12/26/2003  Job:  [id number]   cc:   Patrica Duel, M.D.  [address], [address]  [place]  [place] [address]  Fax: [phone]   Vida Roller, M.D.  Fax: [phone]   Cath Lab

## 2010-06-11 NOTE — H&P (Signed)
[name], [name]               ACCOUNT NO.:  [id number]   MEDICAL RECORD NO.:  [id number]          PATIENT TYPE:  INP   LOCATION:  6529                         FACILITY:  [facility]   PHYSICIAN:  [place] Bing, M.D.  DATE OF BIRTH:  September 12, 1955   DATE OF ADMISSION:  12/23/2003  DATE OF DISCHARGE:                                HISTORY & PHYSICAL   PHYSICIANS:  1.  Patrica Duel, M.D., primary care physician.  2.  Vida Roller, M.D., primary cardiologist.   REASON FOR ADMISSION:  A 55 year old woman with recent acute inferior  myocardial infarction treated with stenting and plans for ultimate CABG  surgery.  She now returns with chest discomfort.   HISTORY OF PRESENT ILLNESS:  [name]'s symptoms at the time of her MI  were quite atypical.  She had soreness over the right pectoral region with  chest wall tenderness.  There was some diaphoresis as well and an episode of  syncope.  She presents now after experiencing lower substernal/epigastric  pressure intermittently for much of the day.  She initially presented to the  cardiac rehabilitation nurses, but they referred her to the emergency  department.  Also in consultation with Dr. Vida Roller who yielded a  recommendation for transfer to [facility].   [name] calls symptoms similar to this on many occasions in the past.  It  typically occurs with anxiety and is relieved with the passage of time or  with benzodiazepines.  She has not tried antacids.  She did not try  nitroglycerin today nor was she given sublingual nitroglycerin in the  emergency department.  Her symptoms have been waxing and waning.  She has  not eaten anything yet today.  At the present time, she feels well.  There  has been no associated dyspnea, diaphoresis, nor nausea.   PAST MEDICAL HISTORY:  1.  Hyperlipidemia.  2.  Tobacco use which was recently discontinued.  3.  Panic attacks that have been controlled with benzodiazepines.  4.  She has a remote history of left hand surgery.   REVIEW OF SYMPTOMS:  Ejection fraction was normal on a post-MI  echocardiogram.  She has had an episode of dizziness since she was released  from the hospital but no other problems.  Notable for some intermittent  abdominal discomfort that she characterizes as gas pains.  She has a normal  bowel habit.  All other systems are reviewed and are negative except as  noted above.   ALLERGIES:  None.   CURRENT MEDICATIONS:  1.  Clopidogrel 75 mg q.d.  2.  Metoprolol 50 mg q.d.  3.  Xanax 1 mg t.i.d.  4.  Aspirin 81 mg q.d.  5.  Atorvastatin 80 mg q.d.  6.  Nitroglycerin p.r.n. which she has not yet used.   SOCIAL HISTORY:  Lives in [place].  Works as an Airline pilot.  Married with  two sons-age 59 and 17.  Denies excessive use of alcohol.   FAMILY HISTORY:  Positive for coronary disease in both of her parents.   PHYSICAL EXAMINATION:  GENERAL:  A very  pleasant, youthful woman in no acute  distress.  VITAL SIGNS:  The temperature is 98, heart rate is 70 and regular,  respirations 15, blood pressure 100/50.  O2 saturation 99% on two liters.  HEENT:  Anicteric sclerae.  Normal lids and conjunctivae.  NECK:  No jugular venous distention.  Normal carotid upstrokes without  bruits.  ENDOCRINE:  No thyromegaly.  HEMATOPOIETIC:  No cervical, inguinal, or axillary adenopathy.  SKIN:  Tanned.  No significant lesions.  LUNGS:  Few rales and coarse breath sounds at the bases.  CARDIOVASCULAR:  Normal first and second heart sounds.  Fourth heart sound  present.  ABDOMEN:  Soft and nontender.  No organomegaly.  No tenderness over the  epigastrium or lower sternum.  EXTREMITIES:  Normal distal pulses.  No edema.  NEUROLOGICAL:  Symmetric strength and tone.  MUSCULOSKELETAL:  No joint deformities.   LABORATORY DATA:  EKG:  Normal sinus rhythm, nondiagnostic inferior Q-waves  with inferior T-wave inversion.  Other laboratory studies notable  for a  normal CBC and normal chemistry profile.  Serum glucose is 87.  Initial  cardiac markers negative.   IMPRESSION:  [name] presents with atypical symptoms that are somewhat  worrisome in light of her atypical symptoms during her recent myocardial  infarction.  If not for that, her discomfort sounds most like anxiety-  related or gastroesophageal reflux disease.  We will treat her empirically  for both of these conditions and obtain serial cardiac markers and  electrocardiograms.  If she feels well in the morning and tests are  negative, I anticipate discharge to home.  The risks of heparin probably  outweigh the benefit in this setting and will be discontinued.  Intravenous  nitroglycerin will be continued for now plus her other usual medications.      Robe   RR/MEDQ  D:  12/23/2003  T:  12/23/2003  Job:  [id number]

## 2010-06-11 NOTE — Assessment & Plan Note (Signed)
[facility]                            CARDIOLOGY OFFICE NOTE   NAME:Escher, [name]                      MRN:          [id number]  DATE:05/16/2006                            DOB:          03/05/1955    HISTORY OF PRESENT ILLNESS:  Ms. Doerner is a 55 year old lady who  suffered inferior myocardial infarction in November 2005.  I placed a  drug-eluting stent in her mid right coronary artery.  She subsequently  underwent CABG by Dr. Laneta Simmers for her multivessel disease.  EF is  preserved.  Unfortunately, she continues to smoke and is getting no  exercise.   She complains of some exertional dyspnea with perhaps some chest  discomfort associated with this.  She thinks this may be due to the  absence of exercise that she is getting and increased stress at work.  She is also having some episodes of numbness in her left face, hand and  arm.  These have not been associated with any weakness.  She has been  referred to a neurologist for this.  She has not yet seen the  neurologist.   Her current medications are:  1. Aspirin 81 mg daily.  2. Xanax 1 mg p.r.n.  3. Toprol-XL 50 mg daily.  4. Plavix 75 mg daily.  5. Zetia 10 mg daily.  6. Flonase 50 mcg daily.  7. Altace 5 mg daily.   She is not taking the Lipitor or Chantix we had previously prescribed.   PHYSICAL EXAMINATION:  She is generally well-appearing in no distress  with heart rate 76, blood pressure 130/74, and weight of 124 pounds.  Weight is up 8 pounds from September.  She has no jugular venous  distention, thyromegaly or lymphadenopathy.  Lungs are clear to  auscultation.  She has a nondisplaced point of maximal cardiac impulse.  There is a regular rate and rhythm without murmur, rub or gallop.  Median sternotomy is well healed.  The abdomen is soft, nondistended,  nontender.  There is no hepatosplenomegaly.  Bowel sounds normal.  No  pulsatile midline mass and no abdominal bruit.  The  extremities are warm  without clubbing, cyanosis, edema or ulceration.   Electrocardiogram demonstrates normal sinus rhythm with minor  nonspecific ST-T abnormalities.   IMPRESSION/RECOMMENDATIONS:  1. Coronary disease:  Doing well after inferior myocardial infarction      and subsequent coronary artery bypass grafting.  Ejection fraction      preserved.  Chest pain and dyspnea may well be related more to her      anxiety and deconditioning than to her heart.  However, with her      known coronary disease we will check ETT Cardiolite.  Continue beta      blocker through this test.  She should be maintained on both      aspirin and Plavix indefinitely, given the drug-eluting stent in      her right coronary artery.  2. Hypercholesterolemia:  Has not been taking her Lipitor.  I      reemphasized the importance of this today and will give her  refills.  She crushes this because she cannot swallow the whole      pill.  3. Tobacco abuse:  Again, strongly advised her on the critical      importance of complete cessation.   We have agreed that she will walk 30 minutes nightly with a goal of  doing the Heart Walk on May 31.  I will follow up with her in  approximately 6 weeks' time to discuss her progress in this goal.     Salvadore Farber, MD  Electronically Signed    WED/MedQ  DD: 05/16/2006  DT: 05/16/2006  Job #: [id number]   cc:   Doreen Beam

## 2010-06-11 NOTE — Cardiovascular Report (Signed)
[name], [name]               ACCOUNT NO.:  [id number]   MEDICAL RECORD NO.:  [id number]          PATIENT TYPE:  INP   LOCATION:  2926                         FACILITY:  [facility]   PHYSICIAN:  Salvadore Farber, M.D. LHCDATE OF BIRTH:  06-25-1955   DATE OF PROCEDURE:  DATE OF DISCHARGE:                              CARDIAC CATHETERIZATION   PROCEDURE:  Coronary angiography, drug-eluting stent placement to the mid  RCA (x2), placement of temporary right ventricular pacing wire.   INDICATION:  [name] is a 55 year old lady without prior history of  cardiovascular disease.  Risk factors are tobacco use and dyslipidemia.  She  developed the acute onset of substernal chest discomfort radiating to the  right arm at 11 p.m.  She presented to [name] at 11 p.m. and she  presented to [name] at 2:30 a.m. with same complaint.  Electrocardiogram demonstrated inferior ST elevations with reciprocal  depressions anteriorly.  She was transferred urgently for cardiac  catheterization with a percutaneous revascularization.  She arrived here  with ongoing pain.  Immediately upon arrival she had profound bradycardia  with heart rates of approximately 30 and blood pressure falling to the low  60s.  This initially resolved spontaneously.  She consented to proceed with  urgent catheterization with a percutaneous revascularization.   PROCEDURAL TECHNIQUE:  Informed consent was obtained.  Under 1% lidocaine  local anesthesia, a 6 French sheath was placed in the right common femoral  artery and a 6 French sheath in the right common femoral vein using the  modified Seldinger technique.  Diagnostic angiography was performed using JL-  4 catheter and a JR-4 coronary guiding catheter.  This demonstrated 95%  stenosis of the mid portion of the RCA.  With imaging of the RCA, she  developed again profound bradycardia which did not respond to an amp of  Atropine.  A temporary pacing wire was  placed via the venous sheath and  positioned in the right ventricular apex.  Pacing was commenced still  without marked improvement in her blood pressure.  Blood pressure remained  approximately 60.  She was started on dopamine with improvement in blood  pressure.  Aggressive fluid hydration was administered.   Having thus resuscitated her, we returned attention to the RCA.  A Luge wire  was advanced across the lesion without difficulty.  The vessel appeared to  be 2.25 to 2.5 mm in diameter, however, given the size of the territory, I  felt that this likely represented the effect of substantial coronary spasm  rather than the true vessel size.  I therefore placed a 2.75 x 16 mm TAXUS  monorail just distal to the patent acute marginal covering the entirety of  the lesion.  After this there was severe stenosis at the distal margin  appeared initially most consistent with spasm.  Again, blood pressure did  not permit administration of intercoronary nitroglycerin.  We attempted to  wait for this to resolve with time.  Subsequent images, however,  demonstrated persistent severity of the stenosis with new haziness  concerning for dissection.  I therefore decided to  cover this segment using  an overlapping 2.5 x 12 mm TAXUS.  This was deployed at 14 atmospheres.  This stent was post dilated using a 2.5 x 18 mm Powercell  at 14  atmospheres.  The proximal stent was post dilated using a 3.0 x 18 mm  Powercell at 14 atmospheres proximally and 16 atmospheres in its mid  section.  Final angiogram demonstrated no residual stenosis at the culprit  lesion, approximately 50% stenosis at the distal margin without any  haziness, and TIMI-III flow to the distal vasculature.  After restenting of  the vessel, heart rate and blood pressure stabilized.  Pacing wire was  removed at the completion of the case.  Dopamine was weaned to off with  maintenance of systolic blood pressure of approximately 90.    FINDINGS:  1.  Left main:  Ostial 30% stenosis.  2. LAD:  Moderate sized vessel with      two diagonals.  There is a 70% stenosis proximal to take off of the      first diagonal and a 60% to 70% stenosis down stream of the take off of      the second diagonal.  3. Circumflex:  Moderate sized vessel giving      grounds to a single branching obtuse marginal.  This is angiographically      normal.  4. RCA:  Large, dominant vessel.  There is proximal 40%      stenosis.  There was  a 95% stenosis in the mid vessel treated, no      residual.  At the completion of the case was a 50% stenosis of the mid      vessel downstream from the stents suggestive of spasm.   COMPLICATIONS:  None.   IMPRESSION/RECOMMENDATIONS:  Successful drug-eluting stent placement in the  mid RCA with establishment of TIMI-III flow and improvement in heart rate  and blood pressure.  The patient should be continued on Plavix for a year.  Aspirin will be continued indefinitely.  She will undergo echocardiogram on  Monday.  Beta-blocker and ACE inhibitor will be withheld until her blood  pressure becomes more stable.  We will plan on repeat catheterization on  Tuesday to reassess the LAD lesions and perhaps perform revascularization of  these.  Smoking cessation was strongly advised.       WED/MEDQ  D:  11/29/2003  T:  11/29/2003  Job:  [id number]

## 2010-06-11 NOTE — Discharge Summary (Signed)
[name], [name]               ACCOUNT NO.:  [id number]   MEDICAL RECORD NO.:  [id number]          PATIENT TYPE:  INP   LOCATION:  2034                         FACILITY:  [facility]   PHYSICIAN:  Evelene Croon, M.D.     DATE OF BIRTH:  1955-10-20   DATE OF ADMISSION:  12/23/2003  DATE OF DISCHARGE:  01/03/2004                                 DISCHARGE SUMMARY   ADMISSION DIAGNOSIS:  Recurrent chest pain after Taxus stents were placed in  the right coronary artery x 2.   PAST MEDICAL HISTORY AND DISCHARGE DIAGNOSES:  1.  Hyperlipidemia.  2.  Panic attacks which are controlled with benzodiazepines.  3.  Status post myocardial infarction, status post placement of Taxus stents      in the right coronary artery x 2.  4.  Coronary artery disease, status post coronary artery bypass grafting x      5.  5.  Postoperative anemia, status post transfusion of two units of packed red      blood cells.  6.  Postoperative sinus tachycardia.  This was thought to be secondary to      the anemia.  The patient is currently on a beta blocker.   ALLERGIES:  No known drug allergies.   BRIEF HISTORY:  The patient is a 55 year old Caucasian female who has  experienced vague chest discomfort for several years.  She presented on  November 29, 2003, with an episode of prolonged chest pain, diaphoresis and  by her description, a syncopal episode.  She was initially evaluated in  [place], [place], and then transferred to [place], [place],  where she underwent an emergent angioplasty of a totally occluded right  coronary artery with Taxus stents x 2.  At the time, she also had  significant disease in the left anterior descending artery.  The patient was  discharged home and was started on Plavix.  The patient returned to cardiac  rehabilitation on December 23, 2003, and described to the rehabilitation  nurse vague chest tightness and discomfort and was therefore readmitted.  She noted that the  current symptoms were not as severe as the initial  presenting symptoms.  After being evaluated by Dr. Samule Ohm, it was his  opinion that the patient should undergo repeat cardiac catheterization and  consulted CVTS for possible surgical revascularization.   HOSPITAL COURSE:  The patient presented with recurrent chest pain after  having Taxus stents x 2 placed in the right coronary artery as previously  stated.  The patient was in stable condition and maintained on routine  hospital care after admission.  She underwent repeat cardiac catheterization  on December 25, 2003, which revealed patent stents in the RCA with residual  three-vessel coronary artery disease and mild decrease in left ventricular  ejection fraction.  Dr. Tyrone Sage of CVTS service saw the patient in  consultation on December 24, 2003, and agreed that the patient should  undergo repeat cardiac catheterization and pending those results could be  scheduled for surgical revascularization by Dr. Laneta Simmers.  This was the final  decision after repeat catheterization and the  patient was subsequently  scheduled for surgery.   The patient was taken to the OR on December 29, 2003, for coronary artery  bypass grafting x 5.  Endoscopic vessel harvesting was performed in the  lateral thighs.  Left internal mammary artery was grafted to the LAD,  saphenous vein was grafted in sequence to the distal right coronary artery  and to the distal PDA, saphenous vein was grafted to the OM and saphenous  vein was grafted to the first diagonal.  The patient tolerated the procedure  well and was hemodynamically stable immediately postoperatively.  The  patient was transferred from the OR to the SICU in stable condition.  The  patient was extubated without complication and woke up from anesthesia  neurologically intact.   On postoperative day #1, the patient was afebrile and maintaining a normal  sinus rhythm.  Her other vital signs were also stable.   It was noted that  she had a postoperative anemia with a hemoglobin of 7.6.  She was therefore  transfused with one unit of packed red blood cells.  The patient was volume  overloaded postoperatively as well and she was diuresed accordingly.  Invasive lines and chest tubes will be continued in a routine manner.   On postoperative day #2, the patient was noted to have a resting sinus  tachycardia and her beta blocker dose was subsequently increased.  The  patient began cardiac rehabilitation and has continued to progress as  expected and is tolerating this well.   On postoperative day #3, the patient's chest x-ray revealed bilateral  pleural effusions, left greater than right.  This was treated with IV Lasix  40 mg x 2.  The patient's hemoglobin was still noted to be decreased at 7.5  on postoperative day #3 and she was therefore transfused with an additional  unit of packed red blood cells.  She was also started on Niferex.  The  patient has continued to maintain a sinus tachycardia throughout the  remainder of the postoperative course.  Secondary to her marginal blood  pressure with a systolic normally in the 90s, her beta blocker dose has not  been increased.  The tachycardia is thought to be secondary to anemia and  should be monitored closely after discharge.   On the evening of postoperative day #3, the patient complained of flashing  lights in the left eye with significant blurring of vision.  Vital signs  were stable and she was maintaining a sinus tachycardic rhythm.  The patient  denied pain or dizziness.  Her vision cleared shortly thereafter and Dr.  Laneta Simmers evaluated the patient.  The patient states that she has experienced  these symptoms in the past, primarily when she was in college and was  diagnosed as atypical migraines.  These symptoms subsequently resolved and  she has not experienced them again throughout the postoperative course.  On postoperative day #4, the  patient is without complaint, bowel function  has returned and she is ambulating well.  She is afebrile and her vital  signs are stable with a blood pressure of 96/59 and a heart rate of 115.  The patient is diuresing well, however, she is still volume overloaded and  will therefore be discharged on p.o. Lasix.  The patient's chest x-ray is  stable with no significant change in the bilateral effusions.  On physical  exam, cardiac reveals a tachycardic rhythm.  The lungs reveal decreased  breath sounds at bilateral bases.  The abdomen is  benign.  The incisions are  healing well.  There is trace edema present in the bilateral lower  extremities.  The patient also was noted to be hypokalemic and this has been  replaced accordingly.  The patient's anemia has improved after the  transfusion.  She will be continued on Niferex and this again should be  monitored post discharge.  The patient is doing well overall and as long as  she continues to progress in the current manner she will be stable for  discharge within the next one to two days.   LABORATORIES:  CBC and BMP on January 02, 2004:  White count 6.8, hemoglobin  8.8, hematocrit 25.3, platelets 194, sodium 140, potassium 3.6, BUN 10,  creatinine 0.5, glucose 93.   CONDITION ON DISCHARGE:  Improved.   DISCHARGE MEDICATIONS:  1.  Aspirin 81 mg daily.  2.  Toprol XL 50 mg daily.  3.  Lipitor 80 mg daily.  4.  Plavix 75 mg daily.  5.  Xanax 1 mg t.i.d.  6.  Lasix 40 mg daily x 7 days.  7.  K-Dur 20 mEq x 7 days.  8.  Niferex 150 mg daily.  9.  Tylox one to two q.4-6h. p.r.n. pain.   ACTIVITY:  No driving.  No lifting more than 10 pounds.  The patient is to  continue daily breathing and walking exercises.   DIET:  Low salt, low fat.   WOUND CARE:  The patient may shower daily and clean the incisions with soap  and water.  If wound problems arise, the patient should contact the CVTS  office.   FOLLOWUP:  1.  Follow up with Dr.  Dorethea Clan on January 15, 2004, at 1 p.m.  A chest x-ray      will be taken at that time.  The patient should also have a CBC and BMP      checked at that time to follow up on her anemia and hypokalemia.  2.  Follow up with Dr. Laneta Simmers three weeks after discharge.  The CVTS office      will contact the patient with the date and time of this appointment.      The patient will be instructed to bring the chest x-ray from her      appointment with Dr. Dorethea Clan to the appointment with Dr. Laneta Simmers.      Aman   AY/MEDQ  D:  01/02/2004  T:  01/03/2004  Job:  [id number]   cc:   Vida Roller, M.D.  Fax: [phone]

## 2010-06-11 NOTE — Discharge Summary (Signed)
NAMECHANTEE, [name]               ACCOUNT NO.:  [id number]   MEDICAL RECORD NO.:  [id number]          PATIENT TYPE:  INP   LOCATION:  4732                         FACILITY:  [facility]   PHYSICIAN:  Salvadore Farber, M.D. LHCDATE OF BIRTH:  1955/02/21   DATE OF ADMISSION:  11/29/2003  DATE OF DISCHARGE:  12/03/2003                           DISCHARGE SUMMARY - REFERRING   DISCHARGE DIAGNOSES:  1.  Inferior myocardial infarction.  2.  Coronary artery disease.  3.  Tobacco abuse.  4.  Panic attacks.  5.  Dyslipidemia, treated.   HOSPITAL COURSE:  Ms. [name] is a 55 year old female with no prior history  of coronary artery disease.  At 11:00 p.m. on November 28, 2003, she  developed substernal chest pain radiating into her right arm associated with  nausea and diaphoresis.  She went to [facility] Emergency Room at 2:30 a.m.,  and her EKG revealed an acute inferior myocardial infarction.  She was  treated with aspirin, heparin, and morphine and transferred to [facility] for  catheterization.  The patient was taken emergently to the cardiac  catheterization lab by Dr. Samule Ohm and found to have subtotal right coronary  artery.  A drug-eluting stent x 2 was placed in the mid-RCA with restoration  in TIMI III flow.  The patient will need to remain on Plavix for  approximately a year.   Unfortunately, the patient does have 30% left main, 70% mid-LAD, and 60-70%  distal LAD disease.  No visual circumflex disease noted.  She did have a  repeat catheterization on December 02, 2003, and she was noted to have 40%  lesion pre-stent and 50% post-stent stenosis of the RCA.  She did have some  diffuse disease of the LAD as mentioned, and Dr. Samule Ohm feels that the  patient will need to have coronary artery bypass grafting in approximately  six months once she recovers from the this, and will remain on Plavix  secondary to drug-eluting stents.  She was discharged to home on December 03, 2003 in stable  condition.   She is going to be discharged on the following medications:  1.  Enteric coated aspirin 325 mg daily.  2.  Plavix 75 mg daily.  3.  Lipitor 80 mg at bedtime.  4.  Toprol XL 50 mg daily.  5.  Xanax as prior to admission.  6.  Sublingual nitroglycerin p.r.n. chest pain.   No lifting, driving, strenuous physical activity, or heavy exertion for two  weeks.  Remain on a low salt, low fat, low cholesterol diet.   ACTIVITY:  As per cardiac rehabilitation.   She is to call if there are any problems with the catheterization site, such  as bruising, swelling, drainage, or discomfort.  Smoking cessation was  recommended.  She has an appointment with Dr. Samule Ohm on January 01, 2004 at  12:00 p.m.  Follow up with Dr. Nobie Putnam as needed.  She is to call if there  are any questions or concerns at [phone].   ADDENDUM:  Lab studies during the patient's hospital stay do note a normal  cortisol level.  Her sodium  was 138, potassium 3.9, BUN 4, creatinine 0.6.  Hemoglobin A1c 5.3.  Platelets 311, hematocrit 34.3, hemoglobin 11.8.  Total  CK 1,601, with 227 MB fractions, with a troponin of 33.25.  Total  cholesterol 168, triglycerides 55, HDL 47, LDL 110.       LB/MEDQ  D:  12/03/2003  T:  12/03/2003  Job:  [id number]   cc:   Patrica Duel, M.D.  [address], [address]  [place]  [place] [address]  Fax: [phone]   Salvadore Farber, M.D. [facility]  [address]  [address]  [place]  [place] [address]

## 2010-06-11 NOTE — Op Note (Signed)
[name], [name]               ACCOUNT NO.:  [id number]   MEDICAL RECORD NO.:  [id number]          PATIENT TYPE:  INP   LOCATION:  2307                         FACILITY:  [facility]   PHYSICIAN:  Evelene Croon, M.D.     DATE OF BIRTH:  [birth year]   DATE OF PROCEDURE:  12/29/2003  DATE OF DISCHARGE:                                 OPERATIVE REPORT   PREOPERATIVE DIAGNOSIS:  Left main and severe three vessel coronary artery  disease with unstable angina.   POSTOPERATIVE DIAGNOSIS:  Left main and severe three vessel coronary artery  disease with unstable angina.   OPERATIVE PROCEDURE:  Median sternotomy, extracorporeal circulation,  coronary bypass graft surgery x 5 using left internal mammary artery graft  to the left anterior descending  coronary artery, saphenous vein graft to  the first diagonal branch of the left anterior descending, saphenous vein  graft to the obtuse marginal branch of the left circumflex coronary artery,  and a sequential saphenous vein graft to the distal right coronary artery  and the posterior descending coronary artery, endoscopic vein harvesting  from the right leg.   SURGEON:  Evelene Croon, M.D.   ASSISTANT:  Pecola Leisure, P.A.-C.   ANESTHESIA:  General endotracheal anesthesia.   CLINICAL HISTORY:  This patient is a 55 year old woman with a strong family  history of heart disease as well as a history of heavy smoking and  hypercholesterolemia who initially presented with chest discomfort in early  November 2005 and ruled in for an acute inferior myocardial infarction.  Cardiac catheterization showed a totally occluded right coronary artery  which was treated with two stents.  The patient also had significant left  anterior descending  disease at that time.  She improved and was discharged  home and was starting cardiac rehab when she developed recurrent chest pain.  She was admitted and underwent repeat cardiac catheterization which showed  about  60% ostial left main coronary stenosis.  The LAD had about 80%  proximal stenosis at the take off of the first diagonal branch.  Both the  LAD and the diagonal branch were relatively small.  The LAD was diffusely  diseased in its mid portion up to about 80%.  The distal vessel was  relatively small.  The left circumflex had about 30% proximal stenosis and  about 50% mid vessel stenosis before a moderate size marginal branch.  The  right coronary artery had about 40% proximal stenosis before the stents.  The stents were widely patent.  There was also about 40% stenosis just  beyond the stents.  The posterior descending branch was a small vessel but  relatively long and had about 70% stenosis in its mid portion.  Left  ventricular ejection fraction was about 50% with mild mitral regurgitation.  An echocardiogram was performed which showed no significant mitral  regurgitation.  After review of the angiogram and examination of the  patient, it was felt that coronary artery bypass graft treatment was the  best treatment to prevent further ischemia and infarction.  I discussed the  operative procedure with the patient including the  alternatives, benefits,  and risks, including bleeding, blood transfusion, infection, stroke,  myocardial infarction, graft failure, and death.  I also discussed the  importance of maximum cardiac risk factor reduction including complete  smoking cessation.  She understood this and agreed to proceed.   OPERATIVE PROCEDURE:  The patient was taken to the operating room and placed  on the table in supine position.  After induction of general endotracheal  anesthesia, a Foley catheter was placed in the bladder using sterile  technique.  The chest, abdomen, and both lower extremities were prepped and  draped in the usual sterile manner.  The chest was entered through a median  sternotomy incision and the pericardium opened in the midline.  Examination  of the heart showed  good ventricular contractility.  The ascending aorta had  no palpable plaques in it.  Then, the left internal mammary artery was  harvested from the chest wall as a pedicle graft.  This was a small caliber  vessel that had excellent blood flow through it.  At the same time, a  segment of greater saphenous vein was harvested from both thighs using  endoscopic vein harvest technique.  This vein was of small to medium caliber  and good quality.  The patient was heparinized and when an adequate  activated clotting time was achieved, the distal ascending aorta was  cannulated using a 20 French aortic cannula for arterial in flow.  Venous  outflow was achieved using two staged venous cannula through the right  atrial appendage.  An antegrade cardioplegia and vent cannula was inserted  in the aortic root.   The patient was placed on cardiopulmonary bypass and the distal coronaries  were identified.  The LAD was a small diffusely diseased vessel.  There was  one area that was soft enough to grasp where I could see a lumen.  This was  about 2 cm beyond the second diagonal branch.  The first diagonal branch was  small but felt to be graftable.  It was heavily diseased proximally.  The  second diagonal branch was too small to graft.  The left circumflex had a  single medium size marginal branch that was graftable.  This was visible  proximally but then became intramyocardial.  The right coronary artery was  diffusely diseased.  There was an area in the distal portion of the vessel  just before the take off of the posterior descending branch which was soft  enough to graft.  There was still some disease in this area.  The posterior  descending branch, itself, was diffusely diseased.  This extended out into  the distal portion of the vessel which was lying beneath the posterior  descending vein.  There was an area in the mid portion of the posterior descending branch which was suitable for grafting and  I was concerned that  there was still distal disease beyond that.   Then, the aorta was crossclamped and 500 mL of cold blood antegrade  cardioplegia was administered in the aortic root with quick arrest of the  heart.  Systemic hypothermia at 28 degrees Centigrade and topical  hypothermia with iced saline was used.  A temperature probe was placed in  the septum and insulating pad in the pericardium.  The first distal  anastomosis was performed to the distal right coronary artery.  The internal  diameter of this vessel was greater than 2.5 mm.  The conduit used was a  segment of greater saphenous vein.  The  anastomosis was performed in a  sequential side-to-side manner using a continuous 7-0 Prolene suture.  Flow  was measured through the graft and was excellent.  The second distal  anastomosis was performed to the distal portion of the posterior descending  coronary artery.  The internal diameter here was about 1.25 mm.  The conduit  used was the same segment of greater saphenous vein.  The anastomosis was  performed in a sequential end-to-side manner using a continuous 7-0 Prolene  suture.  Flow was measured through the graft and was excellent.  Then, a  dose of cardioplegia was given down the vein graft and the aortic root.  The  third distal anastomosis was performed to the obtuse marginal branch.  The  internal diameter was about 1.6 mm.  The conduit used was the second segment  of greater saphenous vein.  The anastomosis was performed in an end-to-side  manner using a continuous 7-0 Prolene suture.  Flow was measured through the  graft and was excellent.  The fourth distal anastomosis was performed to the  first diagonal branch.  The internal diameter was about 1.25 mm.  The  conduit used was a third segment of greater saphenous vein.  The anastomosis  was performed in an end-to-side manner using a continuous 7-0 Prolene  suture.  Flow was measured through the graft and was  excellent.  The fifth  distal anastomosis was performed to the distal portion of the left anterior  descending coronary artery.  The internal diameter of this vessel was about  1.4 mm.  The conduit used was the left internal mammary graft and this was  brought through an opening in the left pericardium anterior to the phrenic  nerve.  It was anastomosed to the LAD in an end-to-side manner using a  continuous 8-0 Prolene suture.  The pedicle was tacked to the epicardium  with 6-0 Prolene sutures.   The patient was rewarmed to 37 degrees Centigrade and the clamp removed from  the mammary pedicle.  There was rapid warming of the ventricular septum and  return of spontaneous ventricular fibrillation.  The Cross clamp was removed  with a time of 84 minutes and the patient defibrillated into a sinus rhythm.  A partial occlusion clamp was placed on the aortic root and the three  proximal vein graft anastomoses were performed in an end-to-side manner using 6-0 Prolene suture.  The clamp was removed, the vein grafts deaired,  and the clamps removed from them.  The proximal and distal anastomoses  appeared hemostatic and the lie of the grafts were satisfactory.  Graft  markers were placed around the proximal anastomoses.  Two temporary right  ventricular and right atrial pacing wires were placed and brought through  the skin.  When the patient had been rewarmed to 37 degrees Centigrade, she  was weaned from cardiopulmonary bypass on no inotropic agents.  Total bypass  time 138 minutes.  Cardiac function appeared good with a cardiac output of 5  liters per minute.  Protamine was given and the venous and aortic cannulae  were removed without difficulty.  Hemostasis was achieved.  Three chest  tubes were placed, one in the posterior pericardium, one in the left pleural  space, and one in the anterior mediastinum.  The pericardium was  reapproximated over the heart.  The sternum was closed with #6  stainless  steel wires.  The fascia was closed with continuous #1 Vicryl suture.  The  subcutaneous tissue was closed  with continuous 2-0 Vicryl and the skin with  3-0 Vicryl subcuticular closure.  The lower extremity vein harvest site was  closed in layers in a similar manner.  The sponge, needle, and instrument  counts were correct according to the scrub nurse.  Dry, sterile dressings  were applied to the incision and around the chest tubes which were hooked to  Pleuravac suction.  The patient remained hemodynamically stable and was  transferred to the SICU in guarded but stable condition.      Darden Amber   BB/MEDQ  D:  12/29/2003  T:  12/29/2003  Job:  [id number]   cc:   Vida Roller, M.D.  Fax: [phone]

## 2010-06-11 NOTE — H&P (Signed)
[name], [name]               ACCOUNT NO.:  [id number]   MEDICAL RECORD NO.:  [id number]          PATIENT TYPE:  INP   LOCATION:  2926                         FACILITY:  [facility]   PHYSICIAN:  Salvadore Farber, M.D. LHCDATE OF BIRTH:  [birth year]   DATE OF ADMISSION:  11/29/2003  DATE OF DISCHARGE:                                HISTORY & PHYSICAL   CHIEF COMPLAINT:  Chest pain.   HISTORY OF PRESENT ILLNESS:  [name] is a 55 year old lady without prior  history of cardiovascular disease.  Atherosclerotic risk factors are tobacco  use and dyslipidemia.  She denies any antecedent history of symptoms  consistent with heart failure or angina.   At 11 p.m. this evening, she developed the abrupt onset of substernal chest  discomfort radiating to her right arm associated with nausea.  She initially  thought this was related to something she had eaten and tried to lay down.  She slept briefly but awoke shortly thereafter with profound diaphoresis.  She then presented to the [facility] Emergency Department at 2:30  this morning.  Electrocardiogram there demonstrated acute inferior  myocardial infarction.  She was treated there with aspirin, heparin and  morphine.  She is transferred emergently for catheterization with a  percutaneous revascularization.   She arrives here with ongoing chest discomfort.  Immediately upon arriving  in the room, heart rate is in the low 30s with a diminished level of  consciousness.  Both heart rate and level of consciousness improved  spontaneously within under a minute.  There was no loss of consciousness.   PAST MEDICAL HISTORY:  Panic attacks, dyslipidemia, left hand surgery in the  distant past.   ALLERGIES:  No known drug allergies.   MEDICATIONS AT HOME:  Xanax.   SOCIAL HISTORY:  The patient is married with two children, aged 91 and 71.  Her husband and eldest son accompany her tonight.  She works as an  Airline pilot.  She uses alcohol  rarely.  She smokes two packs of cigarettes  per day.   FAMILY HISTORY:  Mother is alive at 26 with coronary disease.  Father is  alive in his late 31s, having had a CABG in his 38s.   REVIEW OF SYSTEMS:  Negative in detail except as above.   PHYSICAL EXAMINATION:  This is an ill appearing and somnolent woman in no  distress.  Heart rate initially 30, improving to 91.  Blood pressure with  the higher heart rate was 100/59.  Oxygen saturation of 98% on 2 liters.  She has no jugular venous distention.  The lungs are clear to auscultation  bilaterally.  She has a nondisplaced point of maximal cardiac impulse.  There is a regular rate and rhythm without murmur, or rub.  There is an S4  but no S3.  The abdomen is soft, nondistended, non tender.  There is no  hepatosplenomegaly.  Bowel sounds are normal.  Extremities are warm without  clubbing, cyanosis, edema, or ulceration.  Skin exam is normal with the  exception of a tattoo above the right medial malleolus.  Carotid pulses  2+  bilaterally without bruits.  Femoral pulses 2+ bilaterally without bruits.   LABORATORY STUDIES:  Remarkable for creatinine of 0.8, potassium 3.3,  glucose 130.  Hematocrit 44, platelets 465,000, INR 0.9, troponin 0.01.   ELECTROCARDIOGRAM:  Demonstrated normal sinus rhythm with borderline first  degree AV block.  There is inferior ST elevation with ST depression in the  anterior and lateral leads.   IMPRESSION/RECOMMENDATION:  1.  Inferior myocardial infarction.  Proceeding to emergent catheterization      with possible percutaneous coronary intervention.  Will continue aspirin      and heparin and add double bolus of eptifibatide.  2.  Panic disorder:  Continue Xanax.  3.  Elevated glucose:  Check hemoglobin A1C and blood glucoses.  4.  Lipids:  Check fasting lipid profile.  Add high-dose Statin.  5.  Tobacco abuse:  Smoking cessation consult.  The patient strongly advised      to quit.       WED/MEDQ   D:  11/29/2003  T:  11/30/2003  Job:  [id number]

## 2010-06-11 NOTE — Cardiovascular Report (Signed)
[name], BRICKNER               ACCOUNT NO.:  [id number]   MEDICAL RECORD NO.:  [id number]          PATIENT TYPE:  INP   LOCATION:  2926                         FACILITY:  [facility]   PHYSICIAN:  Salvadore Farber, M.D. LHCDATE OF BIRTH:  November 27, 1955   DATE OF PROCEDURE:  12/02/2003  DATE OF DISCHARGE:                              CARDIAC CATHETERIZATION   PROCEDURE:  Left heart catheterization, coronary angiography.   CARDIOLOGIST:  Salvadore Farber, M.D.   INDICATIONS FOR PROCEDURE:  The patient is a 55 year old lady who suffered  an inferior myocardial infarction on November 29, 2003.  I placed drug-  eluting stents in her RCA.  At that time she was found to have severe  diffuse disease of the LAD.  I was concerned that a lot of this was spasm.  Her blood pressure was low, requiring pressors, and thus precluded the  administration of intercoronary vasodilators to assess this question.  She  returns to the laboratory today for an angiography, primarily of her left  coronary system, to reassess the LAD disease.   DESCRIPTION OF PROCEDURE:  An informed consent was obtained.  Under 1%  lidocaine local anesthesia, the 5-French sheath was placed in the left  common femoral artery using the modified Seldinger technique.  A diagnostic  angiography was performed using the JL4 and JR4 catheters.  Intercoronary  nitroglycerin 200 mcg was administered prior to imaging of the LAD.  The  patient tolerated the procedure well and was transferred to the holding room  in stable condition.   COMPLICATIONS:  None.   FINDINGS:  LV:  121/18/20.  No aortic stenosis on pullback.   RESULTS:  1.  LEFT MAIN CORONARY ARTERY:  The left main coronary artery has a 30%      ostial stenosis without change.  2.  LEFT ANTERIOR DESCENDING CORONARY ARTERY:  The left anterior descending      coronary artery is a relatively large vessel, giving rise to two      diagonal branches.  The proximal vessel is severely  and diffusely      diseased, with moderate calcium.  There is an 80% stenosis before the      takeoff of the first diagonal branch.  The mid-vessel between the two      diagonals is relatively free of disease; however, just downstream of the      second diagonal there is another 70% stenosis in the distal vessel.  3.  CIRCUMFLEX CORONARY ARTERY:  The circumflex coronary artery is a      moderate-sized vessel giving rise to a single obtuse marginal.  It is      angiographically normal.  4.  RIGHT CORONARY ARTERY:  The right coronary artery is a large dominant      vessel.  There is a 40% stenosis proximally.  The stents in the mid-      vessel are widely patent.  There is a 50% stenosis of the distal vessel      at the distal stent margin.   IMPRESSION/RECOMMENDATION:  The patient has severe left anterior descending  coronary artery disease, not responsive to intercoronary nitroglycerin.  This includes a rather long segment of proximal disease involving the ostium  of the left anterior descending coronary artery.  I recommend a coronary  artery bypass graft surgery after approximately six months of Plavix.       WED/MEDQ  D:  12/02/2003  T:  12/02/2003  Job:  [id number]   cc:   Patrica Duel, M.D.  [address]  [place]  [place] [address]  Fax: [phone]

## 2010-06-11 NOTE — Consult Note (Signed)
[name], [name]               ACCOUNT NO.:  [id number]   MEDICAL RECORD NO.:  [id number]          PATIENT TYPE:  INP   LOCATION:  6529                         FACILITY:  [facility]   PHYSICIAN:  Sheliah Plane, MD    DATE OF BIRTH:  Jun 13, 1955   DATE OF CONSULTATION:  12/24/2003  DATE OF DISCHARGE:                                   CONSULTATION   CARDIAC SURGICAL CONSULTATION   REFERRING PHYSICIAN:  Salvadore Farber, M.D.; Follow up Cardiologist:  Vida Roller, M.D.   PRIMARY CARE PHYSICIAN:  Patrica Duel, M.D.   REASON FOR CONSULTATION:  Recurrent chest pain after drug-eluting stents  placed in right coronary artery X2.   HISTORY OF PRESENT ILLNESS:  The patient is a 55 year old female who has,  for several years, had very vague chest discomfort but presented November 29, 2003 with episode of prolonged chest pain with diaphoresis and by her  description a syncopal episode.  She was initially evaluated in [place] and  transferred and underwent emergency angioplasty of a totally occluded right  coronary artery with Taxol stents placed in the right coronary artery X2.  At that time she also had significant disease in the left anterior  descending.  She was discharged home and was started on Plavix and  discharged home following the angioplasty.  Yesterday she returned to start  cardiac rehabilitation and described to the rehabilitation nurse vague chest  tightness and discomfort and was readmitted.  She notes that her current  symptoms are not as severe as the initial presenting symptoms.  Initially  she was going to be discharged home this morning but after consultation with  Dr. Samule Ohm it was decided to repeat her cardiac catheterization tomorrow and  consider bypass surgery.   PAST MEDICAL HISTORY:  Her previous cardiac history is as noted above.   CARDIAC RISK FACTORS:  Patient denies hypertension.  She does have known  hyperlipidemia X2 months.  Denies diabetes.  She has  been a smoker for 30  years, quit three weeks ago.  She has a positive family history. Her father  had bypass surgery in the past.  Her mother is 77 and has known coronary  artery disease and is considering bypass surgery in the coming weeks.  Patient denies claudication.  Otherwise her past medical history is clear.   PAST SURGICAL HISTORY:  Includes a cyst removed from the left hand.   SOCIAL HISTORY:  Patient is married.  She lives in [place] with her  husband.  She has two children.  She works in an Scientist, research (physical sciences).  Occasional alcohol use.   MEDICATIONS:  Include Plavix with last dose on December 24, 2003.  Toprol 50  mg a day.  Aspirin 81 mg a day.  Nitroglycerin PRN.  Xanax 1 mg t.i.d.,  Lipitor 80 mg a day.   ALLERGIES:  Patient denies any drug allergies.   REVIEW OF SYMPTOMS:  CARDIAC review of systems includes chest pain and chest  pressure exacerbated by walking or stress.  She denies resting shortness of  breath, exertional shortness of breath, orthopnea, syncope, palpitations  or  lower extremity edema.  GENERAL:  She denies any constitutional symptoms.  She has occasional cough in the early morning.  She denies any wheezing.  GASTROINTESTINAL:  Notes increased gas but denies any recent blood in her  stool.  NEUROLOGICAL:  Denies any TIA's.  MUSCULOSKELETAL:  She notes  arthritis in her fingers.  GENITOURINARY:  Denies any blood in her urine.  No recent infections.  She has been known to be anemic in the past.  PSYCHIATRIC: She has had history of panic attacks in the past and uses Xanax  PRN.  She denies any TIA's or amaurosis.   PHYSICAL EXAMINATION:  VITAL SIGNS:  Blood pressure 94/52.  Pulse 74 and  regular.  Respiratory rate is 18.  Oxygen saturation is 97%.  Patient is 5  feet, 3 inches tall, weight is 114 pounds.  GENERAL:  Patient is awake, alert and neurologically intact.  HEENT: Pupils equal, round, reactive to light.  NECK:  Is without carotid bruits.   LUNGS:  Clear bilaterally.  CARDIAC: Exam reveals a regular rate and rhythm without murmurs or gallops.  ABDOMEN:  Benign without palpable masses or organomegaly.  EXTREMITIES:  Lower extremities have +1 dorsalis pedis pulse and posterior  tibial pulses.  She has adequate vein in both lower extremities for bypass.   LABORATORY DATA:  Findings include hemoglobin 12.4, hematocrit 36, platelet  count 350,000.   The patient, at the time of this dictation, has not had her repeat cardiac  catheterization.  The catheterization from November 29, 2003 at the time of  her acute inferior myocardial infarction and echocardiogram done at that  time were reviewed.  She has a diffusely diseased left anterior descending  which is a relatively small distal vessel but probably bypassable.  She has  two stents in the right coronary artery with patent vessel.  Circumflex  appeared relatively free of disease.   IMPRESSION:  Recurrent chest pain following acute inferior myocardial  infarction and placement of two Taxol stents in the right coronary artery.   DISCUSSION:  Agree with recommendations to proceed with holding her Plavix  and repeating her cardiac catheterization to delineate the current anatomic  situation.  Based on the previous films, it would not be unreasonable to  bypass the right coronary artery and left anterior descending but would like  to see a repeat cardiac catheterization to confirm the anatomy.  Since the  patient has been on Plavix we will hold that for five days and review the  cardiac catheterization to be done on Thursday, December 25, 2003 and  consider bypass surgery next week.  Dr. Laneta Simmers would be available to do the  surgery on Monday.      [name]   EG/MEDQ  D:  12/24/2003  T:  12/24/2003  Job:  [id number]

## 2010-07-07 ENCOUNTER — Other Ambulatory Visit: Payer: Self-pay

## 2010-07-07 MED ORDER — CLOPIDOGREL BISULFATE 75 MG PO TABS: 75.0000 mg | ORAL_TABLET | Freq: Every day | ORAL | Status: DC

## 2010-09-21 ENCOUNTER — Other Ambulatory Visit: Payer: Self-pay | Admitting: Cardiology

## 2010-10-08 ENCOUNTER — Encounter: Payer: Self-pay | Admitting: Cardiology

## 2010-10-11 ENCOUNTER — Encounter: Payer: Self-pay | Admitting: Cardiology

## 2010-10-11 ENCOUNTER — Telehealth: Payer: Self-pay | Admitting: Cardiology

## 2010-10-11 NOTE — Telephone Encounter (Signed)
Please mail patient order for bloodwork / tg

## 2010-10-12 ENCOUNTER — Ambulatory Visit: Payer: BC Managed Care – PPO | Admitting: Cardiology

## 2010-10-15 ENCOUNTER — Other Ambulatory Visit: Payer: Self-pay

## 2010-10-15 DIAGNOSIS — E785 Hyperlipidemia, unspecified: Secondary | ICD-10-CM

## 2010-10-15 DIAGNOSIS — R5383 Other fatigue: Secondary | ICD-10-CM

## 2010-11-04 ENCOUNTER — Ambulatory Visit: Payer: BC Managed Care – PPO | Admitting: Cardiology

## 2010-11-04 ENCOUNTER — Encounter: Payer: Self-pay | Admitting: Cardiology

## 2010-11-12 ENCOUNTER — Encounter: Payer: Self-pay | Admitting: Cardiology

## 2010-11-12 ENCOUNTER — Ambulatory Visit (INDEPENDENT_AMBULATORY_CARE_PROVIDER_SITE_OTHER): Payer: BC Managed Care – PPO | Admitting: Cardiology

## 2010-11-12 VITALS — BP 121/82 | HR 96 | Ht 62.0 in | Wt 125.0 lb

## 2010-11-12 DIAGNOSIS — E782 Mixed hyperlipidemia: Secondary | ICD-10-CM

## 2010-11-12 DIAGNOSIS — I10 Essential (primary) hypertension: Secondary | ICD-10-CM

## 2010-11-12 DIAGNOSIS — R0602 Shortness of breath: Secondary | ICD-10-CM

## 2010-11-12 DIAGNOSIS — F17200 Nicotine dependence, unspecified, uncomplicated: Secondary | ICD-10-CM

## 2010-11-12 DIAGNOSIS — R0600 Dyspnea, unspecified: Secondary | ICD-10-CM | POA: Insufficient documentation

## 2010-11-12 DIAGNOSIS — I2510 Atherosclerotic heart disease of native coronary artery without angina pectoris: Secondary | ICD-10-CM

## 2010-11-12 MED ORDER — METOPROLOL SUCCINATE ER 50 MG PO TB24: 50.0000 mg | ORAL_TABLET | Freq: Every day | ORAL | Status: DC

## 2010-11-12 NOTE — Progress Notes (Signed)
Clinical Summary Ms. Tagliaferro is a 55 y.o.female presenting for followup. I have not seen her since June of last year. She was seen in the interim by Ms. Lawrence.  She presents with complaints of relative shortness of breath with activity. This is described when she is walking across the parking while at work, carrying her purse and laptop. She has to stop to rest along the way, describing NYHA class 2-3 symptoms. She has occasional chest pain as well, although not always exertional. She does not report any escalating use of nitroglycerin spray.  She has not been compliant with medical therapy over time. Medication list was reviewed. She also did not follow up for reassessment of lipids.  She continues to smoke cigarettes, and we again addressed the importance of smoking cessation. She did not start Wellbutrin following her last visit with Ms. Lawrence.  We discussed basic diet exercise, smoking cessation, compliance with medications, and anticipated followup ischemic testing.   No Known Allergies  Medication list reviewed.  Past Medical History  Diagnosis Date  . CAD (coronary artery disease)     DES RCA 2005, multivessel requiring CABG, LVEF 45-50%, occluded SVG to diagolnal and occluded sequential limb of SVG to PL of RCA 10/10  . Hyperlipidemia   . Hypertension   . Panic attacks   . Osteoporosis   . Myocardial infarction 2005    IMI    Past Surgical History  Procedure Date  . Coronary artery bypass graft 2005    LIMA to LAD, SVG to first diagonal, SVG to OM, SVG to RCA and PDA  . Hand surgery     Left    Family History  Problem Relation Age of Onset  . Coronary artery disease Other     Social History Ms. Lorenzi reports that she has been smoking.  She does not have any smokeless tobacco history on file. Ms. Latka reports that she does not drink alcohol.  Review of Systems No palpitations or syncope. Otherwise reviewed and negative.  Physical Examination Filed  Vitals:   11/12/10 1305  BP: 121/82  Pulse: 96    Normally nourished woman in no acute distress.  HEENT: Conjunctiva and lids normal, oropharynx clear.  Neck: Supple, no carotid bruits, no thyromegaly.  Lungs: Clear to auscultation, diminished more so at left base, nonlabored. No pleural rub.  Cardiac: Regular rate and rhythm, no S3 or pericardial rub.  Abdomen: Soft, nontender.  Extremity: No pitting edema, distal pulses 2+.  Musculoskeletal: No kyphosis.  Neuropsychiatric: Alert and oriented x3   ECG Normal sinus rhythm at 86 beats per minute, nonspecific T-wave changes.    Problem List and Plan

## 2010-11-12 NOTE — Assessment & Plan Note (Signed)
Blood pressure is reasonable today. Do not plan to resume Altace as yet.

## 2010-11-12 NOTE — Assessment & Plan Note (Signed)
Multivessel disease as outlined. Cardiac catheterization from 2010 demonstrated patent bypass grafts with the exception of the sequential limb to a posterior lateral branch, and also occlusion of the diagonal graft. She has been managed medically over time.

## 2010-11-12 NOTE — Assessment & Plan Note (Signed)
Critical importance of smoking cessation was again reviewed and discussed.

## 2010-11-12 NOTE — Assessment & Plan Note (Signed)
Plan to followup with fasting lipid profile and liver function tests. Will need to resume statin therapy, and can better pick dose once labs have returned.

## 2010-11-12 NOTE — Patient Instructions (Signed)
Your physician recommends that you continue on your current medications as directed. Please refer to the Current Medication list given to you today.  Your physician has requested that you have a lexiscan myoview. For further information please visit [email or web address] Please follow instruction sheet, as given.  Your physician recommends that you return for lab work in: next week   Your physician recommends that you schedule a follow-up appointment in: 6 months

## 2010-11-12 NOTE — Assessment & Plan Note (Signed)
As noted above, patient reports somewhat progressive symptoms. This could be multifactorial in light of lack of regular exercise and ongoing tobacco abuse. She has also been noncompliant with regular medications and followup. Baseline ECG is nonspecific. She has not undergone followup ischemic testing since her heart catheterization in 2010. Plan will be to proceed with a Lexiscan Myoview on medical therapy and make further recommendations from there.

## 2010-12-02 ENCOUNTER — Ambulatory Visit (INDEPENDENT_AMBULATORY_CARE_PROVIDER_SITE_OTHER): Payer: BC Managed Care – PPO | Admitting: *Deleted

## 2010-12-02 ENCOUNTER — Ambulatory Visit (HOSPITAL_COMMUNITY): Payer: BC Managed Care – PPO | Attending: Internal Medicine | Admitting: Radiology

## 2010-12-02 VITALS — Ht 62.0 in | Wt 121.0 lb

## 2010-12-02 DIAGNOSIS — R000 Tachycardia, unspecified: Secondary | ICD-10-CM | POA: Insufficient documentation

## 2010-12-02 DIAGNOSIS — R0789 Other chest pain: Secondary | ICD-10-CM | POA: Insufficient documentation

## 2010-12-02 DIAGNOSIS — R42 Dizziness and giddiness: Secondary | ICD-10-CM | POA: Insufficient documentation

## 2010-12-02 DIAGNOSIS — E785 Hyperlipidemia, unspecified: Secondary | ICD-10-CM

## 2010-12-02 DIAGNOSIS — I25810 Atherosclerosis of coronary artery bypass graft(s) without angina pectoris: Secondary | ICD-10-CM

## 2010-12-02 DIAGNOSIS — Z8249 Family history of ischemic heart disease and other diseases of the circulatory system: Secondary | ICD-10-CM | POA: Insufficient documentation

## 2010-12-02 DIAGNOSIS — Z951 Presence of aortocoronary bypass graft: Secondary | ICD-10-CM | POA: Insufficient documentation

## 2010-12-02 DIAGNOSIS — J449 Chronic obstructive pulmonary disease, unspecified: Secondary | ICD-10-CM | POA: Insufficient documentation

## 2010-12-02 DIAGNOSIS — F17200 Nicotine dependence, unspecified, uncomplicated: Secondary | ICD-10-CM | POA: Insufficient documentation

## 2010-12-02 DIAGNOSIS — R0602 Shortness of breath: Secondary | ICD-10-CM

## 2010-12-02 DIAGNOSIS — R0609 Other forms of dyspnea: Secondary | ICD-10-CM | POA: Insufficient documentation

## 2010-12-02 DIAGNOSIS — R5381 Other malaise: Secondary | ICD-10-CM | POA: Insufficient documentation

## 2010-12-02 DIAGNOSIS — I4949 Other premature depolarization: Secondary | ICD-10-CM

## 2010-12-02 DIAGNOSIS — I10 Essential (primary) hypertension: Secondary | ICD-10-CM | POA: Insufficient documentation

## 2010-12-02 DIAGNOSIS — J4489 Other specified chronic obstructive pulmonary disease: Secondary | ICD-10-CM | POA: Insufficient documentation

## 2010-12-02 DIAGNOSIS — R0989 Other specified symptoms and signs involving the circulatory and respiratory systems: Secondary | ICD-10-CM | POA: Insufficient documentation

## 2010-12-02 LAB — LDL CHOLESTEROL, DIRECT: Direct LDL: 156.2 mg/dL

## 2010-12-02 LAB — HEPATIC FUNCTION PANEL
AST: 31 U/L (ref 0–37)
Alkaline Phosphatase: 86 U/L (ref 39–117)
Bilirubin, Direct: 0.8 mg/dL — ABNORMAL HIGH (ref 0.0–0.3)
Total Bilirubin: 1.6 mg/dL — ABNORMAL HIGH (ref 0.3–1.2)

## 2010-12-02 LAB — LIPID PANEL
HDL: 54.4 mg/dL (ref >39.00)
Total CHOL/HDL Ratio: 4
VLDL: 7.4 mg/dL (ref 0.0–40.0)

## 2010-12-02 MED ORDER — TECHNETIUM TC 99M TETROFOSMIN IV KIT
33.0000 | PACK | Freq: Once | INTRAVENOUS | Status: AC | PRN
  Administered 2010-12-02: 33 via INTRAVENOUS

## 2010-12-02 MED ORDER — TECHNETIUM TC 99M TETROFOSMIN IV KIT
11.0000 | PACK | Freq: Once | INTRAVENOUS | Status: AC | PRN
  Administered 2010-12-02: 11 via INTRAVENOUS

## 2010-12-02 NOTE — Progress Notes (Signed)
[facility] SITE 3 NUCLEAR MED [address] [place] [address] [phone]  Cardiology Nuclear Med Study  [name] is a 55 y.o. female [id number] 06/21/1955   Nuclear Med Background Indication for Stress Test:  Evaluation for Ischemia and Stent/Graft Patency  History:  COPD and '05 IWMI>Stent-RCA x 2; '05 CABG; '05 Echo:EF=60%; '08 XLK:GMWNUU, EF=62%; '10 Cath:Patent grafts x 3, 2 occluded to be tx with meds; H/O NICM Cardiac Risk Factors: Family History - CAD, Hypertension, Lipids and Smoker  Symptoms:  Chest Tightness with and without Exertion (last episode of chest discomfort was yesterday), Dizziness, DOE, Fatigue and Rapid HR   Nuclear Pre-Procedure Caffeine/Decaff Intake:  7:30am NPO After: 630 pm   Lungs:  clear IV 0.9% NS with Angio Cath:  22g  IV Site: R Hand  IV Started by:  Bonnita Levan, RN  Chest Size (in):  36 Cup Size: C  Height: 5\' 2"  (1.575 m)  Weight:  121 lb (54.885 kg)  BMI:  Body mass index is 22.13 kg/(m^2). Tech Comments:  Had caffeine this AM, Held Toprol > 24 hrs    Nuclear Med Study 1 or 2 day study: 1 day  Stress Test Type:  Stress  Reading MD: Dietrich Pates, MD Order Authorizing Provider:  [name], MD  Resting Radionuclide: Technetium 25m Tetrofosmin  Resting Radionuclide Dose: 11.0 mCi   Stress Radionuclide:  Technetium 27m Tetrofosmin  Stress Radionuclide Dose: 33.0 mCi           Stress Protocol Rest HR: 98 Stress HR: 151  Rest BP: 85/69 Stress BP: 175/94  Exercise Time (min): 4:30 METS: 6.4   Predicted Max HR: 164 bpm % Max HR: 92.07 bpm Rate Pressure Product: 72536   Dose of Adenosine (mg):  n/a Dose of Lexiscan: n/a mg  Dose of Atropine (mg): n/a Dose of Dobutamine: n/a mcg/kg/min (at max HR)  Stress Test Technologist: Smiley Houseman, CMA-N  Nuclear Technologist:  Domenic Polite, CNMT     Rest Procedure:  Myocardial perfusion imaging was performed at rest 45 minutes following the intravenous  administration of Technetium 66m Tetrofosmin.  Rest ECG: Nonspecific T-wave changes.  Stress Procedure:  The patient exercised for 4:30 on the treadmill utilizing the Bruce protocol.  The patient stopped due to fatigue and denied any chest pain.  There were no diagnostic ST-T wave changes, only occasional PVC's.  Technetium 42m Tetrofosmin was injected at peak exercise and myocardial perfusion imaging was performed after a brief delay.  Stress ECG: No significant ST segment change suggestive of ischemia.  QPS Raw Data Images:  Soft tissue (diaphragm) underlies the heart. Stress Images: Thinning in the distal inferoseptal wall.  Otherwise normal perfusion.   Rest Images: No significant change from the stress images. Subtraction (SDS):  Borderline Transient Ischemic Dilatation (Normal <1.22):  1.00 Lung/Heart Ratio (Normal <0.45):  0.17  Quantitative Gated Spect Images QGS EDV:  51 ml QGS ESV:  19 ml QGS cine images: LVEF is normal.  Normal wall thickening. QGS EF: 64%  Impression Exercise Capacity:  Poor exercise capacity. BP Response:  Normal blood pressure response. Clinical Symptoms:  No chest pain. ECG Impression:  No significant ST segment change suggestive of ischemia. Comparison with Prior Nuclear Study: Normal perfusion on previous report  Overall Impression:  Normal stress nuclear study.  No evidence for significant ischemia or scar.  LVEF 64%.   Dietrich Pates

## 2010-12-03 ENCOUNTER — Telehealth: Payer: Self-pay | Admitting: Cardiology

## 2010-12-07 ENCOUNTER — Telehealth: Payer: Self-pay

## 2010-12-07 DIAGNOSIS — E782 Mixed hyperlipidemia: Secondary | ICD-10-CM

## 2010-12-07 MED ORDER — ROSUVASTATIN CALCIUM 10 MG PO TABS: 10.0000 mg | ORAL_TABLET | Freq: Every day | ORAL | Status: DC

## 2011-02-23 ENCOUNTER — Other Ambulatory Visit (HOSPITAL_COMMUNITY): Payer: Self-pay | Admitting: Internal Medicine

## 2011-02-23 DIAGNOSIS — IMO0002 Reserved for concepts with insufficient information to code with codable children: Secondary | ICD-10-CM

## 2011-02-25 ENCOUNTER — Other Ambulatory Visit (HOSPITAL_COMMUNITY): Payer: Self-pay | Admitting: Interventional Radiology

## 2011-02-25 ENCOUNTER — Telehealth: Payer: Self-pay | Admitting: *Deleted

## 2011-02-25 ENCOUNTER — Encounter (HOSPITAL_COMMUNITY): Payer: Self-pay | Admitting: Pharmacy Technician

## 2011-02-25 ENCOUNTER — Ambulatory Visit (HOSPITAL_COMMUNITY)
Admission: RE | Admit: 2011-02-25 | Discharge: 2011-02-25 | Disposition: A | Discharge status: Home / Self Care | Payer: BC Managed Care – PPO | Source: Ambulatory Visit | Attending: Internal Medicine | Admitting: Internal Medicine

## 2011-02-25 DIAGNOSIS — IMO0002 Reserved for concepts with insufficient information to code with codable children: Secondary | ICD-10-CM

## 2011-02-25 NOTE — Telephone Encounter (Signed)
Patient is typically seen in [place]. I reviewed the last office note from October. If she remains clinically stable from a cardiac perspective, it would be reasonable for her to temporarily hold Plavix as requested for kyphoplasty.

## 2011-02-25 NOTE — Telephone Encounter (Signed)
Requesting for Plavix to be held 3 days prior to kyphoplasty.  Will increase ASA to 325mg  during this time.

## 2011-02-28 ENCOUNTER — Other Ambulatory Visit: Payer: Self-pay | Admitting: Radiology

## 2011-02-28 NOTE — Telephone Encounter (Signed)
Will fax note below.

## 2011-03-04 ENCOUNTER — Ambulatory Visit (HOSPITAL_COMMUNITY)
Admission: RE | Admit: 2011-03-04 | Discharge: 2011-03-04 | Disposition: A | Discharge status: Home / Self Care | Payer: Managed Care, Other (non HMO) | Source: Ambulatory Visit | Attending: Interventional Radiology | Admitting: Interventional Radiology

## 2011-03-04 ENCOUNTER — Other Ambulatory Visit (HOSPITAL_COMMUNITY): Payer: Self-pay | Admitting: Interventional Radiology

## 2011-03-04 DIAGNOSIS — I2510 Atherosclerotic heart disease of native coronary artery without angina pectoris: Secondary | ICD-10-CM | POA: Insufficient documentation

## 2011-03-04 DIAGNOSIS — IMO0002 Reserved for concepts with insufficient information to code with codable children: Secondary | ICD-10-CM

## 2011-03-04 DIAGNOSIS — M8448XA Pathological fracture, other site, initial encounter for fracture: Secondary | ICD-10-CM | POA: Insufficient documentation

## 2011-03-04 DIAGNOSIS — E785 Hyperlipidemia, unspecified: Secondary | ICD-10-CM | POA: Insufficient documentation

## 2011-03-04 DIAGNOSIS — M810 Age-related osteoporosis without current pathological fracture: Secondary | ICD-10-CM | POA: Insufficient documentation

## 2011-03-04 DIAGNOSIS — I10 Essential (primary) hypertension: Secondary | ICD-10-CM | POA: Insufficient documentation

## 2011-03-04 DIAGNOSIS — I252 Old myocardial infarction: Secondary | ICD-10-CM | POA: Insufficient documentation

## 2011-03-04 LAB — CBC
HCT: 44.5 % (ref 36.0–46.0)
Hemoglobin: 14.5 g/dL (ref 12.0–15.0)
MCH: 29.9 pg (ref 26.0–34.0)
MCHC: 32.6 g/dL (ref 30.0–36.0)
RBC: 4.85 MIL/uL (ref 3.87–5.11)

## 2011-03-04 LAB — PROTIME-INR: INR: 1.05 (ref 0.00–1.49)

## 2011-03-04 LAB — BASIC METABOLIC PANEL
BUN: 3 mg/dL — ABNORMAL LOW (ref 6–23)
Chloride: 98 mEq/L (ref 96–112)
GFR calc Af Amer: >90 mL/min (ref >90)
Glucose, Bld: 91 mg/dL (ref 70–99)
Potassium: 4.3 mEq/L (ref 3.5–5.1)
Sodium: 138 mEq/L (ref 135–145)

## 2011-03-04 LAB — DIFFERENTIAL
Basophils Relative: 1 % (ref 0–1)
Lymphs Abs: 1.2 10*3/uL (ref 0.7–4.0)
Monocytes Absolute: 0.5 10*3/uL (ref 0.1–1.0)
Monocytes Relative: 7 % (ref 3–12)
Neutro Abs: 5.1 10*3/uL (ref 1.7–7.7)

## 2011-03-04 MED ORDER — GELATIN ABSORBABLE 12-7 MM EX MISC
CUTANEOUS | Status: AC
  Filled 2011-03-04: qty 1

## 2011-03-04 MED ORDER — MIDAZOLAM HCL 5 MG/5ML IJ SOLN
INTRAMUSCULAR | Status: DC | PRN
  Administered 2011-03-04 (×5): 1 mg via INTRAVENOUS

## 2011-03-04 MED ORDER — IOHEXOL 300 MG/ML  SOLN
50.0000 mL | Freq: Once | INTRAMUSCULAR | Status: AC | PRN
  Administered 2011-03-04: 20 mL

## 2011-03-04 MED ORDER — FENTANYL CITRATE 0.05 MG/ML IJ SOLN
INTRAMUSCULAR | Status: AC
  Filled 2011-03-04: qty 4

## 2011-03-04 MED ORDER — HYDROMORPHONE HCL PF 1 MG/ML IJ SOLN
INTRAMUSCULAR | Status: AC
  Filled 2011-03-04: qty 3

## 2011-03-04 MED ORDER — SODIUM CHLORIDE 0.9 % IV SOLN: INTRAVENOUS | Status: AC

## 2011-03-04 MED ORDER — CEFAZOLIN SODIUM 1-5 GM-% IV SOLN
1.0000 g | Freq: Once | INTRAVENOUS | Status: AC
  Administered 2011-03-04: 1 g via INTRAVENOUS
  Filled 2011-03-04: qty 50

## 2011-03-04 MED ORDER — TOBRAMYCIN SULFATE 1.2 G IJ SOLR
INTRAMUSCULAR | Status: AC
  Filled 2011-03-04: qty 1.2

## 2011-03-04 MED ORDER — SODIUM CHLORIDE 0.9 % IV SOLN
Freq: Once | INTRAVENOUS | Status: AC
  Administered 2011-03-04: 07:00:00 via INTRAVENOUS

## 2011-03-04 MED ORDER — MIDAZOLAM HCL 2 MG/2ML IJ SOLN
INTRAMUSCULAR | Status: AC
  Filled 2011-03-04: qty 6

## 2011-03-04 MED ORDER — ACETAMINOPHEN 160 MG/5 ML PO SOLN
650.0000 mg | Freq: Once | ORAL | Status: AC
  Administered 2011-03-04: 650 mg via ORAL
  Filled 2011-03-04: qty 20.3

## 2011-03-04 MED ORDER — FENTANYL CITRATE 0.05 MG/ML IJ SOLN
INTRAMUSCULAR | Status: DC | PRN
  Administered 2011-03-04 (×4): 25 ug via INTRAVENOUS

## 2011-03-04 NOTE — Procedures (Signed)
S/P KP at L3 and VP at L5 for painful  compression fractures. No acute complications.

## 2011-03-04 NOTE — H&P (Signed)
[name] is an 56 y.o. female.   Chief Complaint: low back pain HPI: Patient with symptomatic L2/L5 compression fractures presents today for L2/L5 kyphoplasty with possible biopsy.  Past Medical History  Diagnosis Date  . CAD (coronary artery disease)     DES RCA 2005, multivessel requiring CABG, LVEF 45-50%, occluded SVG to diagolnal and occluded sequential limb of SVG to PL of RCA 10/10  . Hyperlipidemia   . Hypertension   . Panic attacks   . Osteoporosis   . Myocardial infarction 2005    IMI    Past Surgical History  Procedure Date  . Coronary artery bypass graft 2005    LIMA to LAD, SVG to first diagonal, SVG to OM, SVG to RCA and PDA  . Hand surgery     Left    Family History  Problem Relation Age of Onset  . Coronary artery disease Other    Social History:  reports that she has been smoking.  She does not have any smokeless tobacco history on file. She reports that she does not drink alcohol or use illicit drugs.  Allergies:  Allergies  Allergen Reactions  . Diazepam (Valium) Other (See Comments)    delirium    Medications Prior to Admission  Medication Sig Dispense Refill  . ALPRAZolam (XANAX) 1 MG tablet Take 1 mg by mouth as needed. Up to six times a day for Anxiety      . aspirin 81 MG tablet Take 81 mg by mouth daily.        . clopidogrel (PLAVIX) 75 MG tablet Take 75 mg by mouth daily.      . metoprolol (TOPROL-XL) 50 MG 24 hr tablet Take 1 tablet (50 mg total) by mouth daily.  30 tablet  6  . rosuvastatin (CRESTOR) 40 MG tablet Take 40 mg by mouth daily.      . fluticasone (FLONASE) 50 MCG/ACT nasal spray Place 2 sprays into the nose daily as needed. For seasonal allergies      . meclizine (ANTIVERT) 12.5 MG tablet Take 12.5 mg by mouth 3 (three) times daily as needed. For vertigo      . nitroGLYCERIN (NITROLINGUAL) 0.4 MG/SPRAY spray Place 1 spray under the tongue every 5 (five) minutes as needed. May repeat for up to 3 doses.  For chest pain        Medications Prior to Admission  Medication Dose Route Frequency Provider Last Rate Last Dose  . 0.9 %  sodium chloride infusion   Intravenous Once Robet Leu, PA 20 mL/hr at 03/04/11 0645    . ceFAZolin (ANCEF) IVPB 1 g/50 mL premix  1 g Intravenous Once Robet Leu, PA        Results for orders placed during the hospital encounter of 03/04/11 (from the past 48 hour(s))  APTT     Status: Abnormal   Collection Time   03/04/11  6:45 AM      Component Value Range Comment   aPTT 39 (*) 24 - 37 (seconds)   BASIC METABOLIC PANEL     Status: Abnormal   Collection Time   03/04/11  6:45 AM      Component Value Range Comment   Sodium 138  135 - 145 (mEq/L)    Potassium 4.3  3.5 - 5.1 (mEq/L)    Chloride 98  96 - 112 (mEq/L)    CO2 29  19 - 32 (mEq/L)    Glucose, Bld 91  70 - 99 (mg/dL)  BUN 3 (*) 6 - 23 (mg/dL)    Creatinine, Ser 1.61 (*) 0.50 - 1.10 (mg/dL)    Calcium 9.7  8.4 - 10.5 (mg/dL)    GFR calc non Af Amer >90  >90 (mL/min)    GFR calc Af Amer >90  >90 (mL/min)   CBC     Status: Normal   Collection Time   03/04/11  6:45 AM      Component Value Range Comment   WBC 7.0  4.0 - 10.5 (K/uL)    RBC 4.85  3.87 - 5.11 (MIL/uL)    Hemoglobin 14.5  12.0 - 15.0 (g/dL)    HCT 09.6  04.5 - 40.9 (%)    MCV 91.8  78.0 - 100.0 (fL)    MCH 29.9  26.0 - 34.0 (pg)    MCHC 32.6  30.0 - 36.0 (g/dL)    RDW 81.1  91.4 - 78.2 (%)    Platelets 331  150 - 400 (K/uL)   DIFFERENTIAL     Status: Normal   Collection Time   03/04/11  6:45 AM      Component Value Range Comment   Neutrophils Relative 74  43 - 77 (%)    Neutro Abs 5.1  1.7 - 7.7 (K/uL)    Lymphocytes Relative 17  12 - 46 (%)    Lymphs Abs 1.2  0.7 - 4.0 (K/uL)    Monocytes Relative 7  3 - 12 (%)    Monocytes Absolute 0.5  0.1 - 1.0 (K/uL)    Eosinophils Relative 1  0 - 5 (%)    Eosinophils Absolute 0.1  0.0 - 0.7 (K/uL)    Basophils Relative 1  0 - 1 (%)    Basophils Absolute 0.1  0.0 - 0.1 (K/uL)   PROTIME-INR     Status:  Normal   Collection Time   03/04/11  6:45 AM      Component Value Range Comment   Prothrombin Time 13.9  11.6 - 15.2 (seconds)    INR 1.05  0.00 - 1.49     No results found.  Review of Systems  Constitutional: Negative for fever and chills.  Respiratory: Positive for cough and shortness of breath.   Cardiovascular: Negative for chest pain.  Gastrointestinal: Negative for nausea, vomiting and abdominal pain.  Musculoskeletal: Positive for back pain.  Neurological: Negative for headaches.  Endo/Heme/Allergies: Does not bruise/bleed easily.    Blood pressure 135/86, pulse 102, temperature 97 F (36.1 C), temperature source Oral, resp. rate 18, height 5' 2.5" (1.588 m), weight 120 lb (54.432 kg), SpO2 94.00%. Physical Exam  Constitutional: She is oriented to person, place, and time. She appears well-developed and well-nourished.  Cardiovascular: Normal rate and regular rhythm.   Respiratory: Effort normal.       Distant BS   GI: Soft. Bowel sounds are normal. She exhibits no distension. There is no tenderness.  Musculoskeletal: Normal range of motion. She exhibits no edema.       Tender  low back region  Neurological: She is alert and oriented to person, place, and time.     Assessment/Plan Patient with symptomatic L2/L5 compression fractures; plan is for L2/L5 kyphoplasty with possible biopsy.  [name],D KEVIN 03/04/2011, 8:18 AM

## 2011-03-07 ENCOUNTER — Telehealth (HOSPITAL_COMMUNITY): Payer: Self-pay

## 2011-03-15 ENCOUNTER — Telehealth (HOSPITAL_COMMUNITY): Payer: Self-pay

## 2011-03-18 ENCOUNTER — Ambulatory Visit (HOSPITAL_COMMUNITY)
Admission: RE | Admit: 2011-03-18 | Discharge: 2011-03-18 | Disposition: A | Discharge status: Home / Self Care | Payer: Managed Care, Other (non HMO) | Source: Ambulatory Visit | Attending: Interventional Radiology | Admitting: Interventional Radiology

## 2011-03-18 DIAGNOSIS — IMO0002 Reserved for concepts with insufficient information to code with codable children: Secondary | ICD-10-CM

## 2011-04-12 NOTE — Telephone Encounter (Signed)
Contacts         Type  Contact  Phone    03/15/2011 11:38 AM  Phone (Outgoing)  [name], [name] (Self)  [phone] (H)    Completed- Spoke w/ pt her back is better but she still has on site she she swivals to the rt and its discomfort// size of a pea// mornings no pain as day goes on gets worse standing feels good sitting hurts pain level 6 having to adjust to sit

## 2011-05-11 ENCOUNTER — Other Ambulatory Visit: Payer: Self-pay | Admitting: Cardiology

## 2011-05-12 ENCOUNTER — Other Ambulatory Visit: Payer: Self-pay | Admitting: *Deleted

## 2011-05-12 MED ORDER — CLOPIDOGREL BISULFATE 75 MG PO TABS: 75.0000 mg | ORAL_TABLET | Freq: Every day | ORAL | Status: DC

## 2011-10-14 DIAGNOSIS — R030 Elevated blood-pressure reading, without diagnosis of hypertension: Secondary | ICD-10-CM

## 2011-10-17 ENCOUNTER — Encounter: Payer: Self-pay | Admitting: Cardiology

## 2011-10-17 ENCOUNTER — Telehealth: Payer: Self-pay | Admitting: Cardiology

## 2011-10-17 NOTE — Telephone Encounter (Signed)
Patient states that her HR is elevated and would like to speak with nurse. / tg

## 2011-10-17 NOTE — Telephone Encounter (Signed)
Was sent home off of metoprol s/p d/c from hospital for severe diverticulitis, due to low bp.  Was off for 1 week and pressures returned to 140 range, with HR in 120s, at which time, she was instructed to restart her metoprolol.  Started back this past Friday and continues to notice hr of 90s to on teens systolic.  Went to the ED at [facility] on Saturday, where and ekg was performed, labs reviewed and she was sent home.  Advised her to continue on usual dose of metoprolol and limit caffine, as she admits to 2 liter soda per day.  She is to call back in a few days if symptoms do not improve, otherwise a follow up appt has been made for 10/4.

## 2011-10-28 ENCOUNTER — Ambulatory Visit: Payer: Managed Care, Other (non HMO) | Admitting: Cardiology

## 2011-10-28 ENCOUNTER — Ambulatory Visit (INDEPENDENT_AMBULATORY_CARE_PROVIDER_SITE_OTHER): Payer: BC Managed Care – PPO | Admitting: Cardiology

## 2011-10-28 ENCOUNTER — Encounter: Payer: Self-pay | Admitting: Cardiology

## 2011-10-28 VITALS — BP 124/82 | HR 103 | Ht 62.5 in | Wt 114.1 lb

## 2011-10-28 DIAGNOSIS — E785 Hyperlipidemia, unspecified: Secondary | ICD-10-CM

## 2011-10-28 DIAGNOSIS — I2510 Atherosclerotic heart disease of native coronary artery without angina pectoris: Secondary | ICD-10-CM

## 2011-10-28 DIAGNOSIS — I10 Essential (primary) hypertension: Secondary | ICD-10-CM

## 2011-10-28 DIAGNOSIS — F17200 Nicotine dependence, unspecified, uncomplicated: Secondary | ICD-10-CM

## 2011-10-28 MED ORDER — ROSUVASTATIN CALCIUM 20 MG PO TABS: 20.0000 mg | ORAL_TABLET | Freq: Every day | ORAL | Status: DC

## 2011-10-28 MED ORDER — NITROGLYCERIN 0.4 MG/SPRAY TL SOLN: 1.0000 | Status: DC | PRN

## 2011-10-28 MED ORDER — METOPROLOL SUCCINATE ER 50 MG PO TB24: 50.0000 mg | ORAL_TABLET | Freq: Every day | ORAL | Status: DC

## 2011-10-28 NOTE — Assessment & Plan Note (Signed)
No change in blood pressure regimen at this time.

## 2011-10-28 NOTE — Progress Notes (Signed)
Clinical Summary [name] is a 56 y.o.female presenting for followup. She was seen in October 2012. Recently admitted to [facility] with reported diverticulitis, taken off Plavix and Toprol-XL by Dr. Sherril Croon. She denies having any major bleeding in her stools, but does state that she was hypotensive for a period of time. She is to see Dr. Gabriel Cirri for colonoscopy  ECG from 9/20 showed sinus rhythm with nonspecific T wave changes.  We discussed her ischemic testing within the last year. Followup Myoview from 11/12 showed no ischemia and normal LVEF. She describes a recurring atypical chest pain, nonexertional. Has some leg pain when she ambulates. She has been trying to cut back smoking, has not quit completely. She still misses taking her medications occasionally. She states in fact that she has not been taking Crestor at all.  Today and discuss smoking cessation, regular exercise regimen, compliance with medications. Refills were provided.   Allergies  Allergen Reactions  . Diazepam (Valium) Other (See Comments)    delirium    Current Outpatient Prescriptions  Medication Sig Dispense Refill  . ALPRAZolam (XANAX) 1 MG tablet Take 1 mg by mouth as needed. Up to six times a day for Anxiety      . aspirin 81 MG tablet Take 81 mg by mouth daily.        . ciprofloxacin (CIPRO) 500 MG tablet Take 500 mg by mouth at bedtime.       . clopidogrel (PLAVIX) 75 MG tablet Take 1 tablet (75 mg total) by mouth daily.  30 tablet  12  . fluticasone (FLONASE) 50 MCG/ACT nasal spray Place 2 sprays into the nose daily as needed. For seasonal allergies      . meclizine (ANTIVERT) 12.5 MG tablet Take 12.5 mg by mouth 3 (three) times daily as needed. For vertigo      . metoprolol succinate (TOPROL-XL) 50 MG 24 hr tablet Take 1 tablet (50 mg total) by mouth daily.  30 tablet  11  . nitroGLYCERIN (NITROLINGUAL) 0.4 MG/SPRAY spray Place 1 spray under the tongue every 5 (five) minutes as needed. May repeat for up to 3  doses.  For chest pain  12 g  6  . DISCONTD: metoprolol (TOPROL-XL) 50 MG 24 hr tablet Take 1 tablet (50 mg total) by mouth daily.  30 tablet  6  . DISCONTD: nitroGLYCERIN (NITROLINGUAL) 0.4 MG/SPRAY spray Place 1 spray under the tongue every 5 (five) minutes as needed. May repeat for up to 3 doses.  For chest pain      . DISCONTD: rosuvastatin (CRESTOR) 40 MG tablet Take 40 mg by mouth daily.      . rosuvastatin (CRESTOR) 20 MG tablet Take 1 tablet (20 mg total) by mouth daily.  30 tablet  11    Past Medical History  Diagnosis Date  . CAD (coronary artery disease)     DES RCA 2005, multivessel requiring CABG, LVEF 45-50%, occluded SVG to diagolnal and occluded sequential limb of SVG to PL of RCA 10/10  . Hyperlipidemia   . Hypertension   . Panic attacks   . Osteoporosis   . Myocardial infarction 2005    IMI    Social History [name] reports that she has been smoking.  She does not have any smokeless tobacco history on file. [name] reports that she does not drink alcohol.  Review of Systems No palpitations or syncope. No fevers or chills. Still some mild abdominal discomfort. Stable appetite. Otherwise negative.  Physical Examination Filed  Vitals:   10/28/11 1325  BP: 124/82  Pulse: 103   Filed Weights   10/28/11 1325  Weight: 114 lb 1.9 oz (51.764 kg)    HEENT: Conjunctiva and lids normal, oropharynx clear.  Neck: Supple, no carotid bruits, no thyromegaly.  Lungs: Clear to auscultation, nonlabored. No pleural rub.  Cardiac: Regular rate and rhythm, no S3 or pericardial rub.  Abdomen: Soft, nontender.  Extremity: No pitting edema, distal pulses 2+.  Musculoskeletal: No kyphosis.  Neuropsychiatric: Alert and oriented x3    Problem List and Plan   CORONARY ATHEROSCLEROSIS NATIVE CORONARY ARTERY Relatively stable symptomatically on medical therapy. Myoview from last November showed no active ischemia with normal LV function. Refills were provided for Toprol-XL,  nitroglycerin spray, and also Crestor. Recent ECG was reviewed. Followup arranged in 6 months, sooner if needed.  TOBACCO ABUSE I continue to encourage her to work toward complete smoking cessation.  HYPERLIPIDEMIA We are going to reinitiate Crestor at 20 mg daily. She will be having complete set of lab work with Dr. Sherril Croon in November at which time lipids can be checked.  Essential hypertension, benign No change in blood pressure regimen at this time.    Jonelle Sidle, M.D., F.A.C.C.

## 2011-10-28 NOTE — Assessment & Plan Note (Signed)
I continue to encourage her to work toward complete smoking cessation.

## 2011-10-28 NOTE — Assessment & Plan Note (Signed)
Relatively stable symptomatically on medical therapy. Myoview from last November showed no active ischemia with normal LV function. Refills were provided for Toprol-XL, nitroglycerin spray, and also Crestor. Recent ECG was reviewed. Followup arranged in 6 months, sooner if needed.

## 2011-10-28 NOTE — Assessment & Plan Note (Signed)
We are going to reinitiate Crestor at 20 mg daily. She will be having complete set of lab work with Dr. Sherril Croon in November at which time lipids can be checked.

## 2012-09-06 ENCOUNTER — Other Ambulatory Visit: Payer: Self-pay

## 2012-09-06 ENCOUNTER — Ambulatory Visit (INDEPENDENT_AMBULATORY_CARE_PROVIDER_SITE_OTHER): Payer: Managed Care, Other (non HMO) | Admitting: Cardiology

## 2012-09-06 ENCOUNTER — Encounter: Payer: Self-pay | Admitting: Cardiology

## 2012-09-06 VITALS — BP 128/80 | HR 85 | Ht 62.5 in | Wt 114.1 lb

## 2012-09-06 DIAGNOSIS — E782 Mixed hyperlipidemia: Secondary | ICD-10-CM

## 2012-09-06 DIAGNOSIS — I2510 Atherosclerotic heart disease of native coronary artery without angina pectoris: Secondary | ICD-10-CM

## 2012-09-06 DIAGNOSIS — F17200 Nicotine dependence, unspecified, uncomplicated: Secondary | ICD-10-CM

## 2012-09-06 DIAGNOSIS — I10 Essential (primary) hypertension: Secondary | ICD-10-CM

## 2012-09-06 MED ORDER — CLOPIDOGREL BISULFATE 75 MG PO TABS: 75.0000 mg | ORAL_TABLET | Freq: Every day | ORAL | Status: DC

## 2012-09-06 NOTE — Assessment & Plan Note (Signed)
Blood pressure control looks good today. 

## 2012-09-06 NOTE — Assessment & Plan Note (Signed)
Symptomatically stable on medical therapy. ECG reviewed. Continue observation for now. 

## 2012-09-06 NOTE — Progress Notes (Signed)
   Clinical Summary [name] is a 57 y.o.female last seen in October 2013. She reports no angina symptoms, stable NYHA class 2 dyspnea on exertion, most notable when she is carrying things. She reports compliance with her medications, admits that she forgets to take her Crestor sometimes in the evenings. She has not had any followup lipids.  ECG today showed sinus rhythm with nonspecific T changes.  Still smoking cigarettes, we have discussed smoking cessation.   Allergies  Allergen Reactions  . Diazepam [Valium] Other (See Comments)    delirium    Current Outpatient Prescriptions  Medication Sig Dispense Refill  . ALPRAZolam (XANAX) 1 MG tablet Take 1 mg by mouth as needed. Up to six times a day for Anxiety      . aspirin 81 MG tablet Take 81 mg by mouth daily.        . fluticasone (FLONASE) 50 MCG/ACT nasal spray Place 2 sprays into the nose daily as needed. For seasonal allergies      . meclizine (ANTIVERT) 12.5 MG tablet Take 12.5 mg by mouth 3 (three) times daily as needed. For vertigo      . metoprolol succinate (TOPROL-XL) 50 MG 24 hr tablet Take 1 tablet (50 mg total) by mouth daily.  30 tablet  11  . nitroGLYCERIN (NITROLINGUAL) 0.4 MG/SPRAY spray Place 1 spray under the tongue every 5 (five) minutes as needed. May repeat for up to 3 doses.  For chest pain  12 g  6  . rosuvastatin (CRESTOR) 20 MG tablet Take 1 tablet (20 mg total) by mouth daily.  30 tablet  11  . clopidogrel (PLAVIX) 75 MG tablet Take 1 tablet (75 mg total) by mouth daily.  30 tablet  5   No current facility-administered medications for this visit.    Past Medical History  Diagnosis Date  . CAD (coronary artery disease)     DES RCA 2005, multivessel requiring CABG, LVEF 45-50%, occluded SVG to diagolnal and occluded sequential limb of SVG to PL of RCA 10/10  . Hyperlipidemia   . Hypertension   . Panic attacks   . Osteoporosis   . Myocardial infarction 2005    IMI    Social History [name]  reports that she has been smoking.  She does not have any smokeless tobacco history on file. [name] reports that she does not drink alcohol.  Review of Systems No palpitations, dizziness, syncope. No reported bleeding problems. No cardiac hospitalizations. Otherwise negative.  Physical Examination Filed Vitals:   09/06/12 1352  BP: 128/80  Pulse: 85   Filed Weights   09/06/12 1352  Weight: 114 lb 1.3 oz (51.746 kg)    Appears comfortable at rest. HEENT: Conjunctiva and lids normal, oropharynx clear.  Neck: Supple, no carotid bruits, no thyromegaly.  Lungs: Clear to auscultation, nonlabored. No pleural rub.  Cardiac: Regular rate and rhythm, no S3 or pericardial rub.  Abdomen: Soft, nontender.  Extremity: No pitting edema, distal pulses 2+.  Musculoskeletal: No kyphosis.  Neuropsychiatric: Alert and oriented x3.   Problem List and Plan   CORONARY ATHEROSCLEROSIS NATIVE CORONARY ARTERY Symptomatically stable on medical therapy. ECG reviewed. Continue observation for now.  Essential hypertension, benign Blood pressure control looks good today.  HYPERLIPIDEMIA Patient continues on Crestor, although not completely compliant. Followup FLP and LFTs will be arranged.  TOBACCO ABUSE Discussed smoking cessation again today.    Jonelle Sidle, M.D., F.A.C.C.

## 2012-09-06 NOTE — Patient Instructions (Addendum)
Your physician recommends that you schedule a follow-up appointment in: 6 MONTHS  Your physician recommends that you return for lab work in: THIS WEEK (FASTING LIPID/LIVER)

## 2012-09-06 NOTE — Assessment & Plan Note (Signed)
Discussed smoking cessation again today.

## 2012-09-06 NOTE — Assessment & Plan Note (Signed)
Patient continues on Crestor, although not completely compliant. Followup FLP and LFTs will be arranged.

## 2012-10-05 ENCOUNTER — Other Ambulatory Visit: Payer: Self-pay | Admitting: *Deleted

## 2012-10-05 MED ORDER — METOPROLOL SUCCINATE ER 50 MG PO TB24: 50.0000 mg | ORAL_TABLET | Freq: Every day | ORAL | Status: DC

## 2012-12-25 ENCOUNTER — Other Ambulatory Visit: Payer: Self-pay | Admitting: Cardiology

## 2012-12-31 ENCOUNTER — Telehealth: Payer: Self-pay | Admitting: Cardiology

## 2012-12-31 NOTE — Telephone Encounter (Signed)
Patient has questions regarding being medicated and/or stopping Plavix prior to dental work / tgs

## 2012-12-31 NOTE — Telephone Encounter (Signed)
.  left message to have patient return my call on home phone per noted pt no longer at work, called pt mobile and pt son Ulice Brilliant advised she should be home soon and will inform pt to call our office however to be advised we close in , pt son understood

## 2013-01-02 NOTE — Telephone Encounter (Signed)
.  left message to have patient return my call at work number

## 2013-01-03 NOTE — Telephone Encounter (Signed)
Pt advised in the past she has needed ABT prior to her dentist procedures and she is expecting to have many teeth pulled in the near future and wanted to know the time frame for holding her plavix and 81mg  ASA and if she will need to have ABT prior per has dental exam only tomorrow, please advise

## 2013-01-03 NOTE — Telephone Encounter (Signed)
Spoke to pt to advise results/instructions. Pt understood. Pt then asked if she needs to hold the plavix for bonding which includes drilling per pt was also advised about this possibility and forgot to mention this in the prior phone call, noted Dr. Diona Browner left for the day and noted pt exam tomorrow for dentist, Dr. Purvis Sheffield reviewed pt chart and advised per precautionary measures with drilling to also hold the plavix for 5 days prior to any apts she will have in the future. Pt understood

## 2013-01-03 NOTE — Telephone Encounter (Signed)
She has no clear cardiac indication for antibiotic prior to dental procedure. As far as the Plavix is concerned, it should be held at least 5 days prior to having teeth extracted.

## 2013-01-22 ENCOUNTER — Other Ambulatory Visit: Payer: Self-pay | Admitting: Cardiology

## 2013-11-01 ENCOUNTER — Telehealth: Payer: Self-pay | Admitting: Cardiology

## 2013-11-01 MED ORDER — CLOPIDOGREL BISULFATE 75 MG PO TABS: 75.0000 mg | ORAL_TABLET | Freq: Every day | ORAL | Status: DC

## 2013-11-01 NOTE — Telephone Encounter (Signed)
Received fax refill request  Rx # [id number] Medication:  Clopidogrel 75 mg tablet Qty 30 Sig:  Take one tablet by mouth once daily Physician:  Diona BrownerMcDowell

## 2013-11-01 NOTE — Telephone Encounter (Signed)
Needs f/u, have asked front desk staff to schedule

## 2013-11-08 ENCOUNTER — Telehealth: Payer: Self-pay | Admitting: Cardiology

## 2013-11-08 MED ORDER — METOPROLOL SUCCINATE ER 50 MG PO TB24: 50.0000 mg | ORAL_TABLET | Freq: Every day | ORAL | Status: DC

## 2013-11-08 NOTE — Telephone Encounter (Signed)
Refill complete 

## 2013-11-08 NOTE — Telephone Encounter (Signed)
Needs refill on Metoprolol sent to [facility] / tgs

## 2013-11-22 ENCOUNTER — Encounter: Payer: Self-pay | Admitting: Cardiology

## 2013-11-22 ENCOUNTER — Ambulatory Visit (INDEPENDENT_AMBULATORY_CARE_PROVIDER_SITE_OTHER): Payer: Managed Care, Other (non HMO) | Admitting: Cardiology

## 2013-11-22 VITALS — BP 128/84 | HR 90 | Wt 113.0 lb

## 2013-11-22 DIAGNOSIS — F17200 Nicotine dependence, unspecified, uncomplicated: Secondary | ICD-10-CM

## 2013-11-22 DIAGNOSIS — Z720 Tobacco use: Secondary | ICD-10-CM

## 2013-11-22 DIAGNOSIS — I10 Essential (primary) hypertension: Secondary | ICD-10-CM

## 2013-11-22 DIAGNOSIS — E785 Hyperlipidemia, unspecified: Secondary | ICD-10-CM

## 2013-11-22 DIAGNOSIS — I25119 Atherosclerotic heart disease of native coronary artery with unspecified angina pectoris: Secondary | ICD-10-CM

## 2013-11-22 DIAGNOSIS — E782 Mixed hyperlipidemia: Secondary | ICD-10-CM

## 2013-11-22 NOTE — Assessment & Plan Note (Signed)
She has been inconsistent with Crestor, admitted to this today. We will obtain FLP and LFTs and go from there.

## 2013-11-22 NOTE — Assessment & Plan Note (Signed)
Stable angina on medical therapy. Cardiolite within the last 3 years was low risk, and ECG is normal today. We reviewed her medications, I again addressed smoking cessation with her, recommended a basic walking regimen.

## 2013-11-22 NOTE — Patient Instructions (Signed)
Your physician wants you to follow-up in: 1 year You will receive a reminder letter in the mail two months in advance. If you don't receive a letter, please call our office to schedule the follow-up appointment.      Your physician recommends that you continue on your current medications as directed. Please refer to the Current Medication list given to you today.      Please get fasting blood work (LIPID,LFT'S)        Thank you for choosing [facility] !

## 2013-11-22 NOTE — Assessment & Plan Note (Signed)
We discussed smoking cessation strategies again today. 

## 2013-11-22 NOTE — Progress Notes (Signed)
Reason for visit: CAD, hypertension, hyperlipidemia  Clinical Summary [name] is a 58 y.o.female last seen in August 2014. She has not been consistent with her follow-up, did not have lab work requested at the last visit. Fortunately, she has been relatively stable from a cardiac perspective. She does have stable angina symptoms. ECG today is normal.  She tells me that she had a recent COPD exacerbation that was [facility] as an outpatient [facility]. She continue to smoke cigarettes, has been able to cut down to one every hour. This is been a long-term problem, and she has had a significant problem with initiating a quit attempt. We talked about different strategies today.  Exercise Cardiolite study from November 2012 showed no diagnostic ST segment changes, no evidence of scar or ischemia, LVEF 64%.   Allergies  Allergen Reactions  . Diazepam [Valium] Other (See Comments)    delirium    Current Outpatient Prescriptions  Medication Sig Dispense Refill  . albuterol (PROVENTIL HFA;VENTOLIN HFA) 108 (90 BASE) MCG/ACT inhaler Inhale 1 puff into the lungs every 6 (six) hours as needed for wheezing or shortness of breath.      . ALPRAZolam (XANAX) 1 MG tablet Take 1 mg by mouth as needed. Up to six times a day for Anxiety      . aspirin 81 MG tablet Take 81 mg by mouth daily.        . clopidogrel (PLAVIX) 75 MG tablet Take 1 tablet (75 mg total) by mouth daily.  30 tablet  1  . CRESTOR 20 MG tablet TAKE 1 TABLET ONCE DAILY.  30 tablet  3  . fluticasone (FLONASE) 50 MCG/ACT nasal spray Place 2 sprays into the nose daily as needed. For seasonal allergies      . metoprolol succinate (TOPROL-XL) 50 MG 24 hr tablet Take 1 tablet (50 mg total) by mouth daily.  30 tablet  6  . nitroGLYCERIN (NITROLINGUAL) 0.4 MG/SPRAY spray USE (1) SPRAY UNDER TONGUE EVERY 5 MINUTES AS NEEDED FOR SEVERE CHESTPAIN, NOT TO EXCEED (3) SPRAYS IN A 15 MINUTES TIME FRAME.  12 g  3  . meclizine  (ANTIVERT) 12.5 MG tablet Take 12.5 mg by mouth 3 (three) times daily as needed. For vertigo       No current facility-administered medications for this visit.    Past Medical History  Diagnosis Date  . CAD (coronary artery disease)     DES RCA 2005, multivessel requiring CABG, LVEF 45-50%, occluded SVG to diagolnal and occluded sequential limb of SVG to PL of RCA 10/10  . Hyperlipidemia   . Hypertension   . Panic attacks   . Osteoporosis   . Myocardial infarction 2005    IMI    Past Surgical History  Procedure Laterality Date  . Coronary artery bypass graft  2005    LIMA to LAD, SVG to first diagonal, SVG to OM, SVG to RCA and PDA  . Hand surgery      Left    Social History [name] reports that she has been smoking.  She does not have any smokeless tobacco history on file. [name] reports that she does not drink alcohol.  Review of Systems Complete review of systems negative except as otherwise outlined in the clinical summary and also the following. No fevers or chills, no cough. No claudication. NYHA class 2-3 dyspnea.  Physical Examination Filed Vitals:   11/22/13 1350  BP: 128/84  Pulse: 90   Filed Weights  11/22/13 1350  Weight: 113 lb (51.256 kg)    Appears comfortable at rest.  HEENT: Conjunctiva and lids normal, oropharynx clear.  Neck: Supple, no carotid bruits, no thyromegaly.  Lungs: Clear to auscultation, nonlabored. No pleural rub.  Cardiac: Regular rate and rhythm, no S3 or pericardial rub.  Abdomen: Soft, nontender.  Extremity: No pitting edema, distal pulses 2+.  Musculoskeletal: No kyphosis.  Neuropsychiatric: Alert and oriented x3.    Problem List and Plan   CORONARY ATHEROSCLEROSIS NATIVE CORONARY ARTERY Stable angina on medical therapy. Cardiolite within the last 3 years was low risk, and ECG is normal today. We reviewed her medications, I again addressed smoking cessation with her, recommended a basic walking regimen.  Mixed  hyperlipidemia She has been inconsistent with Crestor, admitted to this today. We will obtain FLP and LFTs and go from there.  TOBACCO ABUSE We discussed smoking cessation strategies again today.  Essential hypertension, benign Reasonable blood pressure control today.    Jonelle SidleSamuel G. McDowell, M.D., F.A.C.C.

## 2013-11-22 NOTE — Assessment & Plan Note (Signed)
Reasonable blood pressure control today. 

## 2014-01-14 ENCOUNTER — Telehealth: Payer: Self-pay | Admitting: *Deleted

## 2014-01-14 NOTE — Telephone Encounter (Signed)
Will, forward to Dr.McDowell

## 2014-01-14 NOTE — Telephone Encounter (Signed)
Pt having several tooth extractions.I gave her our fax number to give to the surgeon she has yet to choose

## 2014-01-14 NOTE — Telephone Encounter (Signed)
Pt needs to know when to stop plavix and baby asprin with dental procedures/tmj

## 2014-01-14 NOTE — Telephone Encounter (Signed)
First check with dentist to make sure that they actually need aspirin and Plavix to be stopped. Not necessary with a general cleaning in most cases. If she is having more intensive dental work, would recommend holding aspirin and Plavix for 1 week prior to the procedure.

## 2014-01-14 NOTE — Telephone Encounter (Signed)
Lm for pt to call back

## 2014-01-23 ENCOUNTER — Telehealth: Payer: Self-pay | Admitting: *Deleted

## 2014-01-23 MED ORDER — CLOPIDOGREL BISULFATE 75 MG PO TABS: 75.0000 mg | ORAL_TABLET | Freq: Every day | ORAL | Status: DC

## 2014-01-23 MED ORDER — ROSUVASTATIN CALCIUM 20 MG PO TABS: 20.0000 mg | ORAL_TABLET | Freq: Every day | ORAL | Status: DC

## 2014-01-23 NOTE — Telephone Encounter (Signed)
crestor 20 mg #30 tab and clopidogrel 75 mg #30 tab

## 2014-01-23 NOTE — Telephone Encounter (Signed)
Medication sent to pharmacy  

## 2014-09-16 ENCOUNTER — Telehealth: Payer: Self-pay | Admitting: Cardiology

## 2014-09-16 NOTE — Telephone Encounter (Signed)
Called patient and left message on voice mail to call back.

## 2014-09-16 NOTE — Telephone Encounter (Signed)
Would like to know if Dr Diona Browner got information and if he read over it.  She stated that it would have been concerning her Aorta

## 2014-09-17 NOTE — Telephone Encounter (Signed)
Patient notified that X-Ray results have been scanned in to her chart.

## 2014-10-08 ENCOUNTER — Other Ambulatory Visit: Payer: Self-pay | Admitting: Internal Medicine

## 2014-10-08 DIAGNOSIS — R921 Mammographic calcification found on diagnostic imaging of breast: Secondary | ICD-10-CM

## 2014-10-09 ENCOUNTER — Telehealth: Payer: Self-pay

## 2014-10-09 NOTE — Telephone Encounter (Signed)
Spoke with patient regarding stopping Plavix prior to breast biopsy scheduled on 10-16-14.  The patient was instructed to stop Plavix 7 days prior to biopsy (per Dr. [name]).  She was instructed to not take her Plavix starting today, 10-09-14 and to restart Plavix 24-48 hours after the biopsy.  The patient verbalized an understanding of these instructions.

## 2014-10-15 ENCOUNTER — Other Ambulatory Visit: Payer: Self-pay | Admitting: *Deleted

## 2014-10-15 ENCOUNTER — Ambulatory Visit (INDEPENDENT_AMBULATORY_CARE_PROVIDER_SITE_OTHER): Payer: Managed Care, Other (non HMO) | Admitting: Cardiology

## 2014-10-15 ENCOUNTER — Encounter: Payer: Self-pay | Admitting: Cardiology

## 2014-10-15 VITALS — BP 118/76 | HR 74 | Ht 63.0 in | Wt 123.0 lb

## 2014-10-15 DIAGNOSIS — I2510 Atherosclerotic heart disease of native coronary artery without angina pectoris: Secondary | ICD-10-CM

## 2014-10-15 DIAGNOSIS — I10 Essential (primary) hypertension: Secondary | ICD-10-CM

## 2014-10-15 DIAGNOSIS — E785 Hyperlipidemia, unspecified: Secondary | ICD-10-CM

## 2014-10-15 DIAGNOSIS — F17200 Nicotine dependence, unspecified, uncomplicated: Secondary | ICD-10-CM

## 2014-10-15 DIAGNOSIS — Z720 Tobacco use: Secondary | ICD-10-CM

## 2014-10-15 DIAGNOSIS — I739 Peripheral vascular disease, unspecified: Secondary | ICD-10-CM

## 2014-10-15 MED ORDER — NITROGLYCERIN 0.4 MG SL SUBL: 0.4000 mg | SUBLINGUAL_TABLET | SUBLINGUAL | Status: DC | PRN

## 2014-10-15 MED ORDER — NITROGLYCERIN 0.4 MG/SPRAY TL SOLN: Status: DC

## 2014-10-15 MED ORDER — METOPROLOL SUCCINATE ER 50 MG PO TB24: 50.0000 mg | ORAL_TABLET | Freq: Every day | ORAL | Status: DC

## 2014-10-15 NOTE — Telephone Encounter (Signed)
New prescription sent for the tablets so patient can decide on which one she prefers.

## 2014-10-15 NOTE — Progress Notes (Signed)
Cardiology Office Note  Date: 10/15/2014   ID: [name], DOB [birth year], MRN [id number]  PCP: [name]., MD  Primary Cardiologist: [name], MD   Chief Complaint  Patient presents with  . Coronary Artery Disease    History of Present Illness: [name] is a 59 y.o. female last seen in October 2015. She presents for a routine follow-up visit today. She tells me that she was taken out of work by Dr. 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 She is still smoking cigarettes, trying to quit, using Wellbutrin. This is been a long-term struggle for her.  From a cardiac perspective, she denies any angina symptoms or nitroglycerin use. Follow-up ECG today showed sinus rhythm with nonspecific T-wave changes. Medications are reviewed below. She does tell me that she has been taking Crestor for the last few months. Plans to have a full physical with lab work in early November.  She follows with Dr. Ophelia Charter with a history of vertebroplasty at L5 and L2. She had a recent plain film obtained that incidentally noted calcification of the abdominal aorta and common iliacs. We discussed the fact that she likely has peripheral arterial disease, although not entirely clear that it is symptomatic at this time. We discussed indications for medical therapy, continued efforts at complete smoking cessation.   Past Medical History  Diagnosis Date  . CAD (coronary artery disease)     DES RCA 2005, multivessel requiring CABG, LVEF 45-50%, occluded SVG to diagonal and occluded sequential limb of SVG to PL of RCA 10/10  . Hyperlipidemia   . Hypertension   . Panic attacks   . Osteoporosis   . Myocardial infarction 2005    IMI    Past Surgical History  Procedure Laterality Date  . Coronary artery bypass graft  2005    LIMA to LAD, SVG to first diagonal, SVG to OM, SVG to RCA and PDA  . Hand surgery      Left    Current Outpatient  Prescriptions  Medication Sig Dispense Refill  . albuterol (PROVENTIL HFA;VENTOLIN HFA) 108 (90 BASE) MCG/ACT inhaler Inhale 1 puff into the lungs every 6 (six) hours as needed for wheezing or shortness of breath.    . ALPRAZolam (XANAX) 1 MG tablet Take 1 mg by mouth as needed. Up to six times a day for Anxiety    . aspirin 81 MG tablet Take 81 mg by mouth daily.      . BUPROPION HCL PO Take 150 mg by mouth 2 (two) times daily.    . clopidogrel (PLAVIX) 75 MG tablet Take 1 tablet (75 mg total) by mouth daily. 90 tablet 3  . Fluticasone Furoate-Vilanterol (BREO ELLIPTA) 200-25 MCG/INH AEPB Inhale 1 puff into the lungs daily.    . metoprolol succinate (TOPROL-XL) 50 MG 24 hr tablet Take 1 tablet (50 mg total) by mouth daily. 30 tablet 6  . nitroGLYCERIN (NITROLINGUAL) 0.4 MG/SPRAY spray USE (1) SPRAY UNDER TONGUE EVERY 5 MINUTES AS NEEDED FOR SEVERE CHESTPAIN, NOT TO EXCEED (3) SPRAYS IN A 15 MINUTES TIME FRAME. 12 g 3  . rosuvastatin (CRESTOR) 20 MG tablet Take 1 tablet (20 mg total) by mouth daily. 90 tablet 3   No current facility-administered medications for this visit.    Allergies:  Diazepam   Social History: The patient  reports that she has been smoking Cigarettes.  She has never used smokeless tobacco. She reports that she does not  drink alcohol or use illicit drugs.   ROS:  Please see the history of present illness. Otherwise, complete review of systems is positive for NYHA class II-III dyspnea.  All other systems are reviewed and negative.   Physical Exam: VS:  BP 118/76 mmHg  Pulse 74  Ht  (1.6 m)  Wt 123 lb (55.792 kg)  BMI 21.79 kg/m2  SpO2 99%, BMI Body mass index is 21.79 kg/(m^2).  Wt Readings from Last 3 Encounters:  10/15/14 123 lb (55.792 kg)  11/22/13 113 lb (51.256 kg)  09/06/12 114 lb 1.3 oz (51.746 kg)     Appears comfortable at rest. Wearing oxygen via nasal cannula. HEENT: Conjunctiva and lids normal, oropharynx clear.  Neck: Supple, no carotid  bruits, no thyromegaly.  Lungs: Clear to auscultation, nonlabored. No pleural rub.  Cardiac: Regular rate and rhythm, no S3 or pericardial rub.  Abdomen: Soft, nontender. No bruit. Extremity: No pitting edema, distal pulses !+ DP on left and 2+ DP on right.  Musculoskeletal: No kyphosis.  Neuropsychiatric: Alert and oriented x3.    ECG: ECG is ordered today.   Other Studies Reviewed Today:  Exercise Cardiolite study from November 2012 showed no diagnostic ST segment changes, no evidence of scar or ischemia, LVEF 64%.  Assessment and Plan:  1. Multivessel CAD status post CABG with known graft disease and previous DES to the RCA as outlined above. She is symptomatically stable without angina on medical therapy. No changes were made to current regimen, she is on DAPT and reports using statin therapy within the last few months more consistently. ECG reviewed and stable.  2. Aortoiliac calcification by plain films done recently per Dr. Ophelia Charter, likely has underlying PAD, mildly reduced DP on the left. No claudication. We will obtain an abdominal ultrasound to exclude AAA and also ABIs for baseline.  3. Long-standing tobacco abuse. She continues to work on smoking cessation, but has not been able to quit for any length of time. She is now on Wellbutrin and being followed by Dr. Sherril Croon. The critical importance of smoking cessation was discussed again.  4. Reported end-stage COPD diagnosed by Dr. Sherril Croon and now on oxygen. Patient states that she was taken out of work related to this by Dr. Sherril Croon.  5. History of hyperlipidemia, she reports being scheduled to have follow-up lab work with Dr. Sherril Croon at physical in November. I encouraged her to stay on Crestor.  Current medicines were reviewed with the patient today.   Orders Placed This Encounter  Procedures  . EKG 12-Lead    Disposition: FU with me in 6 months.   Signed, Jonelle Sidle, MD, [facility] 10/15/2014 2:59 PM    [facility] [address] [place], [place], [place] [address] Phone: [phone]; Fax: [phone]

## 2014-10-15 NOTE — Patient Instructions (Signed)
Your physician recommends that you continue on your current medications as directed. Please refer to the Current Medication list given to you today. Your physician has requested that you have a lower extremity arterial exercise duplex. During this test, exercise and ultrasound are used to evaluate arterial blood flow in the legs. Allow one hour for this exam. There are no restrictions or special instructions. Your physician has requested that you have an abdominal aorta duplex. During this test, an ultrasound is used to evaluate the aorta. Allow 30 minutes for this exam. Do not eat after midnight the day before and avoid carbonated beverages Your physician recommends that you schedule a follow-up appointment in: 6 months. You will receive a reminder letter in the mail in about 4 months reminding you to call and schedule your appointment. If you don't receive this letter, please contact our office.

## 2014-10-16 ENCOUNTER — Ambulatory Visit
Admission: RE | Admit: 2014-10-16 | Discharge: 2014-10-16 | Disposition: A | Discharge status: Home / Self Care | Payer: Managed Care, Other (non HMO) | Source: Ambulatory Visit | Attending: Internal Medicine | Admitting: Internal Medicine

## 2014-10-16 ENCOUNTER — Other Ambulatory Visit: Payer: Self-pay | Admitting: Internal Medicine

## 2014-10-16 DIAGNOSIS — R921 Mammographic calcification found on diagnostic imaging of breast: Secondary | ICD-10-CM

## 2014-10-22 ENCOUNTER — Ambulatory Visit (INDEPENDENT_AMBULATORY_CARE_PROVIDER_SITE_OTHER): Payer: Managed Care, Other (non HMO)

## 2014-10-22 DIAGNOSIS — I739 Peripheral vascular disease, unspecified: Secondary | ICD-10-CM

## 2014-10-22 DIAGNOSIS — I2510 Atherosclerotic heart disease of native coronary artery without angina pectoris: Secondary | ICD-10-CM

## 2014-10-22 DIAGNOSIS — I700 Atherosclerosis of aorta: Secondary | ICD-10-CM | POA: Diagnosis not present

## 2014-10-24 ENCOUNTER — Telehealth: Payer: Self-pay | Admitting: *Deleted

## 2014-10-24 NOTE — Telephone Encounter (Signed)
-----   Message from Jonelle Sidle, MD sent at 10/23/2014  1:36 PM EDT ----- Reviewed report. Aorto-iliac atherosclerosis present as shown on her plain films of the abdomen, however she had no evidence of AAA or significant obstruction.

## 2014-10-24 NOTE — Telephone Encounter (Deleted)
-----   Message from Jonelle Sidle, MD sent at 10/23/2014  1:42 PM EDT ----- Reviewed report. No evidence of segmental lower extremity arterial disease at rest, bilaterally. Normal ABI's, bilaterally. Normal great toe-brachial index on the right. Abnormal great toe-brachial index on the left - this likely explains decreased DP on the left, could be very distal PAD, however this will be managed medically.

## 2014-10-24 NOTE — Telephone Encounter (Signed)
Patient informed. 

## 2014-10-24 NOTE — Telephone Encounter (Signed)
-----   Message from Daiwik Buffalo G Koralyn Prestage, MD sent at 10/23/2014  1:42 PM EDT ----- Reviewed report. No evidence of segmental lower extremity arterial disease at rest, bilaterally. Normal ABI's, bilaterally. Normal great toe-brachial index on the right. Abnormal great toe-brachial index on the left - this likely explains decreased DP on the left, could be very distal PAD, however this will be managed medically. 

## 2014-11-12 ENCOUNTER — Other Ambulatory Visit: Payer: Self-pay | Admitting: General Surgery

## 2014-11-12 DIAGNOSIS — N6091 Unspecified benign mammary dysplasia of right breast: Secondary | ICD-10-CM

## 2014-11-26 ENCOUNTER — Other Ambulatory Visit: Payer: Self-pay | Admitting: General Surgery

## 2014-11-26 DIAGNOSIS — N6091 Unspecified benign mammary dysplasia of right breast: Secondary | ICD-10-CM

## 2014-11-27 ENCOUNTER — Telehealth: Payer: Self-pay | Admitting: Cardiology

## 2014-11-27 NOTE — Telephone Encounter (Signed)
Bridgett from short stay was made aware.

## 2014-11-27 NOTE — Telephone Encounter (Signed)
Short stay now calling back for clarification on stopping both Plavix and ASA or only plavix

## 2014-11-27 NOTE — Progress Notes (Signed)
Pt called regarding last dose of Plavix and 81mg  aspirin being 11/25/14 and is this okay. Surgery on 12/03/14. Dr. Ival BibleMcDowell's, Cardiologist, office called [phone], awaiting call back for further instructions from him. Pt's number is [phone].

## 2014-11-27 NOTE — Telephone Encounter (Signed)
Pt is having breast Lumpectomy 11/9 with Dr [name] and says she was told to stop Plavix and ASA on November 1st, which she did even though no note documented that she should do so. Will forward to Dr. Diona BrownerMcDowell

## 2014-11-27 NOTE — Telephone Encounter (Signed)
Bridgett made aware

## 2014-11-27 NOTE — Telephone Encounter (Signed)
Yes, she can hold both aspirin and Plavix for her breast lumpectomy.

## 2014-11-27 NOTE — Telephone Encounter (Signed)
Generally need to hold Plavix 7 days prior to invasive procedure. It sounds like she has done this if she stopped it on the first, and the procedure is scheduled on the ninth.

## 2014-11-27 NOTE — Telephone Encounter (Signed)
Bridgett (nurse with [facility])needs to know what to do about patient's Plavix her last does was Nov 1st.  She can be reached at   [phone]  Until 6:30 pm today

## 2014-11-28 ENCOUNTER — Encounter (HOSPITAL_COMMUNITY): Payer: Self-pay

## 2014-11-28 ENCOUNTER — Encounter (HOSPITAL_COMMUNITY)
Admission: RE | Admit: 2014-11-28 | Discharge: 2014-11-28 | Disposition: A | Discharge status: Home / Self Care | Payer: Managed Care, Other (non HMO) | Source: Ambulatory Visit | Attending: General Surgery | Admitting: General Surgery

## 2014-11-28 DIAGNOSIS — I2510 Atherosclerotic heart disease of native coronary artery without angina pectoris: Secondary | ICD-10-CM | POA: Diagnosis not present

## 2014-11-28 DIAGNOSIS — Z951 Presence of aortocoronary bypass graft: Secondary | ICD-10-CM | POA: Insufficient documentation

## 2014-11-28 DIAGNOSIS — I10 Essential (primary) hypertension: Secondary | ICD-10-CM | POA: Diagnosis not present

## 2014-11-28 DIAGNOSIS — Z79899 Other long term (current) drug therapy: Secondary | ICD-10-CM | POA: Insufficient documentation

## 2014-11-28 DIAGNOSIS — Z01812 Encounter for preprocedural laboratory examination: Secondary | ICD-10-CM | POA: Diagnosis not present

## 2014-11-28 DIAGNOSIS — E785 Hyperlipidemia, unspecified: Secondary | ICD-10-CM | POA: Diagnosis not present

## 2014-11-28 DIAGNOSIS — N6091 Unspecified benign mammary dysplasia of right breast: Secondary | ICD-10-CM | POA: Insufficient documentation

## 2014-11-28 DIAGNOSIS — I252 Old myocardial infarction: Secondary | ICD-10-CM | POA: Diagnosis not present

## 2014-11-28 DIAGNOSIS — M810 Age-related osteoporosis without current pathological fracture: Secondary | ICD-10-CM | POA: Insufficient documentation

## 2014-11-28 DIAGNOSIS — Z7902 Long term (current) use of antithrombotics/antiplatelets: Secondary | ICD-10-CM | POA: Insufficient documentation

## 2014-11-28 DIAGNOSIS — Z9981 Dependence on supplemental oxygen: Secondary | ICD-10-CM | POA: Insufficient documentation

## 2014-11-28 DIAGNOSIS — Z7982 Long term (current) use of aspirin: Secondary | ICD-10-CM | POA: Insufficient documentation

## 2014-11-28 DIAGNOSIS — Z01818 Encounter for other preprocedural examination: Secondary | ICD-10-CM | POA: Insufficient documentation

## 2014-11-28 DIAGNOSIS — J449 Chronic obstructive pulmonary disease, unspecified: Secondary | ICD-10-CM | POA: Insufficient documentation

## 2014-11-28 HISTORY — DX: Chronic obstructive pulmonary disease, unspecified: J44.9

## 2014-11-28 HISTORY — DX: Personal history of other diseases of the respiratory system: Z87.09

## 2014-11-28 HISTORY — DX: Personal history of diseases of the blood and blood-forming organs and certain disorders involving the immune mechanism: Z86.2

## 2014-11-28 HISTORY — DX: Unspecified osteoarthritis, unspecified site: M19.90

## 2014-11-28 LAB — CBC
HEMATOCRIT: 40.8 % (ref 36.0–46.0)
HEMOGLOBIN: 13.4 g/dL (ref 12.0–15.0)
MCH: 30 pg (ref 26.0–34.0)
MCHC: 32.8 g/dL (ref 30.0–36.0)
MCV: 91.5 fL (ref 78.0–100.0)
Platelets: 327 10*3/uL (ref 150–400)
RBC: 4.46 MIL/uL (ref 3.87–5.11)
RDW: 13.8 % (ref 11.5–15.5)
WBC: 8.6 10*3/uL (ref 4.0–10.5)

## 2014-11-28 LAB — BASIC METABOLIC PANEL
Anion gap: 10 (ref 5–15)
CHLORIDE: 94 mmol/L — AB (ref 101–111)
CO2: 31 mmol/L (ref 22–32)
Calcium: 9.4 mg/dL (ref 8.9–10.3)
Creatinine, Ser: 0.47 mg/dL (ref 0.44–1.00)
GFR calc Af Amer: >60 mL/min (ref >60)
GFR calc non Af Amer: >60 mL/min (ref >60)
Glucose, Bld: 99 mg/dL (ref 65–99)
POTASSIUM: 3.9 mmol/L (ref 3.5–5.1)
SODIUM: 135 mmol/L (ref 135–145)

## 2014-11-28 NOTE — Pre-Procedure Instructions (Signed)
    Amanda GreenerKendra E [name]  11/28/2014      [name] [name] - [name] [name] [id number] Phone: [phone] Fax: [phone]    Your procedure is scheduled on Wednesday, [name], 2016.  [facility].M.  Call this number if you have problems the morning of surgery:  [phone]   Remember:  Do not eat food or drink liquids after midnight.   Take these medicines the morning of surgery with A SIP OF WATER: Albuterol inhaler if needed (please bring with you), Alprazolam (Xanax) if needed, Bupropion (Wellbutrin), Fluticasone Furoate-Vilanterol (Breo) inhaler, Metoprolol Succinate (Toprol-XL).  5 days prior to surgery, stop taking: Plavix, Aspirin, [name], Aleve, Naproxen, Ibuprofen, Advil, Motrin, [name], [name], Fish oil, all herbal medications, and all vitamins.     Do not wear jewelry, make-up or nail polish.  Do not wear lotions, powders, or perfumes.  You may NOT wear deodorant.  Do not shave 48 hours prior to surgery.   Do not bring valuables to the hospital.  [name] any belongings or valuables.  Contacts, dentures or bridgework may not be worn into surgery.  Leave your suitcase in the car.  After surgery it may be brought to your room.  For patients admitted to the hospital, discharge time will be determined by your treatment team.  Patients discharged the day of surgery will not be allowed to drive home.   Special instructions:  See attached.   Please read over the following fact sheets that you were given. Pain Booklet, Coughing and Deep Breathing and Surgical Site Infection Prevention

## 2014-11-28 NOTE — Progress Notes (Signed)
PCP - Dr. Sherril CroonVyas Cardiologist - Dr. Diona BrownerMcDowell  EKG - 10/15/2014 CXR- denies  Echo- 2005' Stress test- 2012 Cardiac Cath - pt. Is unsure of last cath  Patient denies chest pain and shortness of breath at PAT appointment.  Patient has stopped her Plavix and will stop her Aspirin, 5 days prior to surgery.

## 2014-11-28 NOTE — Progress Notes (Signed)
   11/28/14 1531  OBSTRUCTIVE SLEEP APNEA  Have you ever been diagnosed with sleep apnea through a sleep study? No  Do you snore loudly (loud enough to be heard through closed doors)?  1  Do you often feel tired, fatigued, or sleepy during the daytime (such as falling asleep during driving or talking to someone)? 1  Has anyone observed you stop breathing during your sleep? 1  Do you have, or are you being treated for high blood pressure? 1  BMI more than 35 kg/m2? 0  Age > 50 (1-yes) 1  Neck circumference greater than:Female 16 inches or larger, Female 17inches or larger? 0  Female Gender (Yes=1) 0  Obstructive Sleep Apnea Score 5

## 2014-12-01 ENCOUNTER — Ambulatory Visit
Admission: RE | Admit: 2014-12-01 | Discharge: 2014-12-01 | Disposition: A | Discharge status: Home / Self Care | Payer: Managed Care, Other (non HMO) | Source: Ambulatory Visit | Attending: General Surgery | Admitting: General Surgery

## 2014-12-01 DIAGNOSIS — N6091 Unspecified benign mammary dysplasia of right breast: Secondary | ICD-10-CM

## 2014-12-01 NOTE — Progress Notes (Signed)
Anesthesia Chart Review: Patient is a 59 year old female scheduled for right breast radioactive seed localized lumpectomy on 12/03/14 by Dr. [name]. DX: Right breast atypical lobular hyperplasia.   History includes smoking, CAD/MI s/p DES RCA 11/2003 s/p CABG (LIMA-LAD, SVG-D1, SVG-OM, SVG-dRCA-PDA) 12/29/03 with occluded SVG-DIAG and occluded sequential portion of SVG-PDA '10, HLD, HTN, panic attacks, osteoporosis, COPD on home oxygen, anemia, migraines. OSA screening score 5.   PCP is Dr. Sherril CroonVyas. Cardiologist is Dr. Diona BrownerMcDowell who felt she would be "intermediate risk" and gave permission to hold ASA/Plavix temporarily.  Meds include albuterol, Xanax, ASA 81 mg, Wellbutrin XL, Plavix, Breo Ellipta, Toprol XL, Nitro, oxygen, Crestor. ASA/Plavix held 5 days pre-operatively.  12/03/10 Nuclear stress test: Overall Impression: Normal stress nuclear study. No evidence for significant ischemia or scar. LVEF 64%.   11/18/08 LHC: EF 45-50%. Mild inferior wall hypokinesis.  Occlusion of the diagonal graft. Continued patency of the SVG-distal right circulation with occlusion of the sequential limb likely to a PL branch (op note cites PDA). Continued patency of a small caliber LIMA to distal LAD and SVG to OM.   10/22/14 Abdominal Aorta duplex: Small caliber abdominal aorta, common and external iliac arteries, without focal stenosis, or dilatation. Aorto-iliac atherosclerosis, without stenosis. IVC is patent.  10/15/14 EKG: SR, non-specific T wave abnormality. (I could not get the EKG to load in Epic, so [name] staff faxed a copy.)   Pre-operative labs noted.   She has cardiac clearance with "intermediate risk." She is on home O2 due to COPD/on-going tobacco abuse. Radioactive seed implant today with planned lumpectomy on 12/03/14.  If no acute cardiopulmonary issues then I would anticipate that she may proceed as planned.  Velna Ochsllison Teven Mittman, PA-C [facility]/Anesthesiology Phone [phone]  [phone] 12/01/2014 10:10 AM

## 2014-12-03 ENCOUNTER — Encounter (HOSPITAL_COMMUNITY)
Admission: RE | Disposition: A | Discharge status: Home / Self Care | Payer: Self-pay | Source: Ambulatory Visit | Attending: General Surgery

## 2014-12-03 ENCOUNTER — Encounter (HOSPITAL_COMMUNITY): Payer: Self-pay | Admitting: *Deleted

## 2014-12-03 ENCOUNTER — Ambulatory Visit (HOSPITAL_COMMUNITY): Payer: Managed Care, Other (non HMO) | Admitting: Vascular Surgery

## 2014-12-03 ENCOUNTER — Ambulatory Visit (HOSPITAL_COMMUNITY): Payer: Managed Care, Other (non HMO) | Admitting: Anesthesiology

## 2014-12-03 ENCOUNTER — Ambulatory Visit (HOSPITAL_COMMUNITY)
Admission: RE | Admit: 2014-12-03 | Discharge: 2014-12-04 | Disposition: A | Discharge status: Home / Self Care | Payer: Managed Care, Other (non HMO) | Source: Ambulatory Visit | Attending: General Surgery | Admitting: General Surgery

## 2014-12-03 ENCOUNTER — Ambulatory Visit
Admission: RE | Admit: 2014-12-03 | Discharge: 2014-12-03 | Disposition: A | Discharge status: Home / Self Care | Payer: Managed Care, Other (non HMO) | Source: Ambulatory Visit | Attending: General Surgery | Admitting: General Surgery

## 2014-12-03 DIAGNOSIS — Z7982 Long term (current) use of aspirin: Secondary | ICD-10-CM | POA: Diagnosis not present

## 2014-12-03 DIAGNOSIS — G43909 Migraine, unspecified, not intractable, without status migrainosus: Secondary | ICD-10-CM | POA: Diagnosis not present

## 2014-12-03 DIAGNOSIS — I2510 Atherosclerotic heart disease of native coronary artery without angina pectoris: Secondary | ICD-10-CM | POA: Insufficient documentation

## 2014-12-03 DIAGNOSIS — E7800 Pure hypercholesterolemia, unspecified: Secondary | ICD-10-CM | POA: Insufficient documentation

## 2014-12-03 DIAGNOSIS — Z951 Presence of aortocoronary bypass graft: Secondary | ICD-10-CM | POA: Diagnosis not present

## 2014-12-03 DIAGNOSIS — F419 Anxiety disorder, unspecified: Secondary | ICD-10-CM | POA: Diagnosis not present

## 2014-12-03 DIAGNOSIS — I110 Hypertensive heart disease with heart failure: Secondary | ICD-10-CM | POA: Diagnosis not present

## 2014-12-03 DIAGNOSIS — I509 Heart failure, unspecified: Secondary | ICD-10-CM | POA: Diagnosis not present

## 2014-12-03 DIAGNOSIS — N6091 Unspecified benign mammary dysplasia of right breast: Secondary | ICD-10-CM | POA: Insufficient documentation

## 2014-12-03 DIAGNOSIS — I252 Old myocardial infarction: Secondary | ICD-10-CM | POA: Diagnosis not present

## 2014-12-03 DIAGNOSIS — J449 Chronic obstructive pulmonary disease, unspecified: Secondary | ICD-10-CM | POA: Insufficient documentation

## 2014-12-03 DIAGNOSIS — Z23 Encounter for immunization: Secondary | ICD-10-CM | POA: Diagnosis not present

## 2014-12-03 DIAGNOSIS — M1990 Unspecified osteoarthritis, unspecified site: Secondary | ICD-10-CM | POA: Diagnosis not present

## 2014-12-03 DIAGNOSIS — D0501 Lobular carcinoma in situ of right breast: Secondary | ICD-10-CM | POA: Insufficient documentation

## 2014-12-03 DIAGNOSIS — Z888 Allergy status to other drugs, medicaments and biological substances status: Secondary | ICD-10-CM | POA: Insufficient documentation

## 2014-12-03 HISTORY — PX: BREAST LUMPECTOMY WITH RADIOACTIVE SEED LOCALIZATION: SHX6424

## 2014-12-03 SURGERY — BREAST LUMPECTOMY WITH RADIOACTIVE SEED LOCALIZATION
Anesthesia: General | Site: Breast | Laterality: Right

## 2014-12-03 MED ORDER — DIPHENHYDRAMINE HCL 50 MG/ML IJ SOLN
INTRAMUSCULAR | Status: DC | PRN
  Administered 2014-12-03: 10 mg via INTRAVENOUS

## 2014-12-03 MED ORDER — INFLUENZA VAC SPLIT QUAD 0.5 ML IM SUSY
0.5000 mL | PREFILLED_SYRINGE | INTRAMUSCULAR | Status: AC
  Administered 2014-12-04: 0.5 mL via INTRAMUSCULAR
  Filled 2014-12-03: qty 0.5

## 2014-12-03 MED ORDER — CLOPIDOGREL BISULFATE 75 MG PO TABS
75.0000 mg | ORAL_TABLET | Freq: Every day | ORAL | Status: DC
  Administered 2014-12-04: 75 mg via ORAL
  Filled 2014-12-03: qty 1

## 2014-12-03 MED ORDER — PROMETHAZINE HCL 25 MG/ML IJ SOLN: 6.2500 mg | INTRAMUSCULAR | Status: DC | PRN

## 2014-12-03 MED ORDER — MORPHINE SULFATE (PF) 2 MG/ML IV SOLN: 1.0000 mg | INTRAVENOUS | Status: DC | PRN

## 2014-12-03 MED ORDER — LACTATED RINGERS IV SOLN
INTRAVENOUS | Status: DC | PRN
  Administered 2014-12-03: 10:00:00 via INTRAVENOUS

## 2014-12-03 MED ORDER — SCOPOLAMINE 1 MG/3DAYS TD PT72
1.0000 | MEDICATED_PATCH | TRANSDERMAL | Status: DC
  Administered 2014-12-03: 1.5 mg via TRANSDERMAL

## 2014-12-03 MED ORDER — SODIUM CHLORIDE 0.9 % IJ SOLN
INTRAMUSCULAR | Status: AC
  Filled 2014-12-03: qty 10

## 2014-12-03 MED ORDER — DEXAMETHASONE SODIUM PHOSPHATE 10 MG/ML IJ SOLN
INTRAMUSCULAR | Status: DC | PRN
  Administered 2014-12-03: 10 mg via INTRAVENOUS

## 2014-12-03 MED ORDER — GLYCOPYRROLATE 0.2 MG/ML IJ SOLN
INTRAMUSCULAR | Status: AC
Start: 2014-12-03 — End: 2014-12-03
  Filled 2014-12-03: qty 1

## 2014-12-03 MED ORDER — FENTANYL CITRATE (PF) 100 MCG/2ML IJ SOLN
INTRAMUSCULAR | Status: DC | PRN
  Administered 2014-12-03: 25 ug via INTRAVENOUS

## 2014-12-03 MED ORDER — BUPROPION HCL ER (XL) 150 MG PO TB24
150.0000 mg | ORAL_TABLET | Freq: Two times a day (BID) | ORAL | Status: DC
  Administered 2014-12-03 – 2014-12-04 (×3): 150 mg via ORAL
  Filled 2014-12-03 (×3): qty 1

## 2014-12-03 MED ORDER — CEFAZOLIN SODIUM-DEXTROSE 2-3 GM-% IV SOLR
2.0000 g | INTRAVENOUS | Status: AC
  Administered 2014-12-03: 2 g via INTRAVENOUS

## 2014-12-03 MED ORDER — METOPROLOL SUCCINATE ER 50 MG PO TB24
50.0000 mg | ORAL_TABLET | Freq: Every day | ORAL | Status: DC
  Administered 2014-12-04: 50 mg via ORAL
  Filled 2014-12-03: qty 1

## 2014-12-03 MED ORDER — CEFAZOLIN SODIUM-DEXTROSE 2-3 GM-% IV SOLR
INTRAVENOUS | Status: AC
  Filled 2014-12-03: qty 50

## 2014-12-03 MED ORDER — ONDANSETRON HCL 4 MG/2ML IJ SOLN
INTRAMUSCULAR | Status: AC
  Filled 2014-12-03: qty 2

## 2014-12-03 MED ORDER — NITROGLYCERIN 0.4 MG SL SUBL: 0.4000 mg | SUBLINGUAL_TABLET | SUBLINGUAL | Status: DC | PRN

## 2014-12-03 MED ORDER — GLYCOPYRROLATE 0.2 MG/ML IJ SOLN
INTRAMUSCULAR | Status: DC | PRN
  Administered 2014-12-03: 0.2 mg via INTRAVENOUS

## 2014-12-03 MED ORDER — CHLORHEXIDINE GLUCONATE 4 % EX LIQD: 1.0000 "application " | Freq: Once | CUTANEOUS | Status: DC

## 2014-12-03 MED ORDER — ONDANSETRON HCL 4 MG/2ML IJ SOLN: 4.0000 mg | Freq: Four times a day (QID) | INTRAMUSCULAR | Status: DC | PRN

## 2014-12-03 MED ORDER — HYDROMORPHONE HCL 1 MG/ML IJ SOLN
INTRAMUSCULAR | Status: AC
  Administered 2014-12-03: 0.5 mg via INTRAVENOUS
  Filled 2014-12-03: qty 1

## 2014-12-03 MED ORDER — ASPIRIN EC 81 MG PO TBEC
81.0000 mg | DELAYED_RELEASE_TABLET | Freq: Every day | ORAL | Status: DC
Start: 2014-12-04 — End: 2014-12-04
  Administered 2014-12-04: 81 mg via ORAL
  Filled 2014-12-03: qty 1

## 2014-12-03 MED ORDER — PANTOPRAZOLE SODIUM 40 MG IV SOLR
40.0000 mg | Freq: Every day | INTRAVENOUS | Status: DC
  Administered 2014-12-03: 40 mg via INTRAVENOUS
  Filled 2014-12-03: qty 40

## 2014-12-03 MED ORDER — FENTANYL CITRATE (PF) 250 MCG/5ML IJ SOLN
INTRAMUSCULAR | Status: AC
  Filled 2014-12-03: qty 5

## 2014-12-03 MED ORDER — ONDANSETRON HCL 4 MG/2ML IJ SOLN
INTRAMUSCULAR | Status: DC | PRN
  Administered 2014-12-03: 4 mg via INTRAVENOUS

## 2014-12-03 MED ORDER — PROPOFOL 10 MG/ML IV BOLUS
INTRAVENOUS | Status: AC
  Filled 2014-12-03: qty 20

## 2014-12-03 MED ORDER — PROPOFOL 10 MG/ML IV BOLUS
INTRAVENOUS | Status: DC | PRN
  Administered 2014-12-03: 170 mg via INTRAVENOUS

## 2014-12-03 MED ORDER — PNEUMOCOCCAL VAC POLYVALENT 25 MCG/0.5ML IJ INJ
0.5000 mL | INJECTION | INTRAMUSCULAR | Status: AC
  Administered 2014-12-04: 0.5 mL via INTRAMUSCULAR
  Filled 2014-12-03: qty 0.5

## 2014-12-03 MED ORDER — DIPHENHYDRAMINE HCL 50 MG/ML IJ SOLN
INTRAMUSCULAR | Status: AC
  Filled 2014-12-03: qty 1

## 2014-12-03 MED ORDER — ALPRAZOLAM 0.5 MG PO TABS: 1.0000 mg | ORAL_TABLET | ORAL | Status: DC | PRN

## 2014-12-03 MED ORDER — LIDOCAINE HCL (CARDIAC) 20 MG/ML IV SOLN
INTRAVENOUS | Status: DC | PRN
  Administered 2014-12-03: 60 mg via INTRAVENOUS

## 2014-12-03 MED ORDER — HYDROMORPHONE HCL 1 MG/ML IJ SOLN
0.2500 mg | INTRAMUSCULAR | Status: DC | PRN
  Administered 2014-12-03 (×2): 0.5 mg via INTRAVENOUS

## 2014-12-03 MED ORDER — EPHEDRINE SULFATE 50 MG/ML IJ SOLN
INTRAMUSCULAR | Status: DC | PRN
  Administered 2014-12-03: 10 mg via INTRAVENOUS
  Administered 2014-12-03: 5 mg via INTRAVENOUS

## 2014-12-03 MED ORDER — LACTATED RINGERS IV SOLN
INTRAVENOUS | Status: DC
  Administered 2014-12-03: 08:00:00 via INTRAVENOUS

## 2014-12-03 MED ORDER — OXYCODONE-ACETAMINOPHEN 5-325 MG PO TABS: 1.0000 | ORAL_TABLET | ORAL | Status: DC | PRN

## 2014-12-03 MED ORDER — LIDOCAINE HCL (CARDIAC) 20 MG/ML IV SOLN
INTRAVENOUS | Status: AC
  Filled 2014-12-03: qty 5

## 2014-12-03 MED ORDER — DEXAMETHASONE SODIUM PHOSPHATE 10 MG/ML IJ SOLN
INTRAMUSCULAR | Status: AC
  Filled 2014-12-03: qty 1

## 2014-12-03 MED ORDER — MIDAZOLAM HCL 5 MG/5ML IJ SOLN
INTRAMUSCULAR | Status: DC | PRN
  Administered 2014-12-03: 2 mg via INTRAVENOUS

## 2014-12-03 MED ORDER — BUDESONIDE-FORMOTEROL FUMARATE 160-4.5 MCG/ACT IN AERO
2.0000 | INHALATION_SPRAY | Freq: Two times a day (BID) | RESPIRATORY_TRACT | Status: DC
  Filled 2014-12-03 (×2): qty 6

## 2014-12-03 MED ORDER — SCOPOLAMINE 1 MG/3DAYS TD PT72
MEDICATED_PATCH | TRANSDERMAL | Status: AC
  Filled 2014-12-03: qty 1

## 2014-12-03 MED ORDER — KCL IN DEXTROSE-NACL 20-5-0.9 MEQ/L-%-% IV SOLN
INTRAVENOUS | Status: DC
  Filled 2014-12-03 (×3): qty 1000

## 2014-12-03 MED ORDER — FLUTICASONE FUROATE-VILANTEROL 200-25 MCG/INH IN AEPB: 1.0000 | INHALATION_SPRAY | Freq: Every day | RESPIRATORY_TRACT | Status: DC

## 2014-12-03 MED ORDER — NITROGLYCERIN 0.4 MG/SPRAY TL SOLN: 1.0000 | Status: DC | PRN

## 2014-12-03 MED ORDER — OXYCODONE-ACETAMINOPHEN 5-325 MG PO TABS
ORAL_TABLET | ORAL | Status: AC
  Administered 2014-12-03: 2
  Filled 2014-12-03: qty 2

## 2014-12-03 MED ORDER — ROSUVASTATIN CALCIUM 20 MG PO TABS
20.0000 mg | ORAL_TABLET | Freq: Every day | ORAL | Status: DC
  Administered 2014-12-03 – 2014-12-04 (×2): 20 mg via ORAL
  Filled 2014-12-03 (×2): qty 1

## 2014-12-03 MED ORDER — ONDANSETRON 4 MG PO TBDP: 4.0000 mg | ORAL_TABLET | Freq: Four times a day (QID) | ORAL | Status: DC | PRN

## 2014-12-03 MED ORDER — BUPIVACAINE-EPINEPHRINE 0.25% -1:200000 IJ SOLN
INTRAMUSCULAR | Status: DC | PRN
Start: 2014-12-03 — End: 2014-12-03
  Administered 2014-12-03: 15 mL

## 2014-12-03 MED ORDER — EPHEDRINE SULFATE 50 MG/ML IJ SOLN
INTRAMUSCULAR | Status: AC
  Filled 2014-12-03: qty 1

## 2014-12-03 MED ORDER — BUPIVACAINE-EPINEPHRINE (PF) 0.25% -1:200000 IJ SOLN
INTRAMUSCULAR | Status: AC
  Filled 2014-12-03: qty 30

## 2014-12-03 MED ORDER — ALBUTEROL SULFATE (2.5 MG/3ML) 0.083% IN NEBU: 2.5000 mg | INHALATION_SOLUTION | Freq: Four times a day (QID) | RESPIRATORY_TRACT | Status: DC | PRN

## 2014-12-03 MED ORDER — MIDAZOLAM HCL 2 MG/2ML IJ SOLN
INTRAMUSCULAR | Status: AC
  Filled 2014-12-03: qty 4

## 2014-12-03 MED ORDER — KCL IN DEXTROSE-NACL 20-5-0.45 MEQ/L-%-% IV SOLN
INTRAVENOUS | Status: AC
  Filled 2014-12-03: qty 1000

## 2014-12-03 SURGICAL SUPPLY — 39 items
APPLIER CLIP 9.375 MED OPEN (MISCELLANEOUS) ×2
BINDER BREAST LRG (GAUZE/BANDAGES/DRESSINGS) ×2 IMPLANT
BINDER BREAST XLRG (GAUZE/BANDAGES/DRESSINGS) IMPLANT
BLADE SURG 15 STRL LF DISP TIS (BLADE) ×1 IMPLANT
BLADE SURG 15 STRL SS (BLADE) ×1
CANISTER SUCTION 2500CC (MISCELLANEOUS) ×2 IMPLANT
CHLORAPREP W/TINT 26ML (MISCELLANEOUS) ×2 IMPLANT
CLIP APPLIE 9.375 MED OPEN (MISCELLANEOUS) ×1 IMPLANT
COVER PROBE W GEL 5X96 (DRAPES) ×2 IMPLANT
COVER SURGICAL LIGHT HANDLE (MISCELLANEOUS) ×2 IMPLANT
DEVICE DUBIN SPECIMEN MAMMOGRA (MISCELLANEOUS) ×2 IMPLANT
DRAPE CHEST BREAST 15X10 FENES (DRAPES) IMPLANT
DRAPE UTILITY W/TAPE 26X15 (DRAPES) IMPLANT
ELECT COATED BLADE 2.86 ST (ELECTRODE) ×2 IMPLANT
ELECT REM PT RETURN 9FT ADLT (ELECTROSURGICAL) ×2
ELECTRODE REM PT RTRN 9FT ADLT (ELECTROSURGICAL) ×1 IMPLANT
GLOVE BIO SURGEON STRL SZ7.5 (GLOVE) ×4 IMPLANT
GLOVE BIOGEL PI IND STRL 7.0 (GLOVE) ×1 IMPLANT
GLOVE BIOGEL PI INDICATOR 7.0 (GLOVE) ×1
GLOVE SURG SS PI 7.0 STRL IVOR (GLOVE) ×2 IMPLANT
GOWN STRL REUS W/ TWL LRG LVL3 (GOWN DISPOSABLE) ×2 IMPLANT
GOWN STRL REUS W/TWL LRG LVL3 (GOWN DISPOSABLE) ×2
KIT BASIN OR (CUSTOM PROCEDURE TRAY) ×2 IMPLANT
KIT MARKER MARGIN INK (KITS) ×2 IMPLANT
LIQUID BAND (GAUZE/BANDAGES/DRESSINGS) ×2 IMPLANT
NEEDLE HYPO 25X1 1.5 SAFETY (NEEDLE) ×2 IMPLANT
NS IRRIG 1000ML POUR BTL (IV SOLUTION) ×2 IMPLANT
PACK SURGICAL SETUP 50X90 (CUSTOM PROCEDURE TRAY) ×2 IMPLANT
PENCIL BUTTON HOLSTER BLD 10FT (ELECTRODE) ×2 IMPLANT
SPONGE LAP 18X18 X RAY DECT (DISPOSABLE) ×2 IMPLANT
SUT MNCRL AB 4-0 PS2 18 (SUTURE) ×2 IMPLANT
SUT SILK 2 0 SH (SUTURE) ×2 IMPLANT
SUT VIC AB 3-0 SH 18 (SUTURE) ×2 IMPLANT
SYR BULB 3OZ (MISCELLANEOUS) ×2 IMPLANT
SYR CONTROL 10ML LL (SYRINGE) ×2 IMPLANT
TOWEL OR 17X24 6PK STRL BLUE (TOWEL DISPOSABLE) IMPLANT
TOWEL OR 17X26 10 PK STRL BLUE (TOWEL DISPOSABLE) ×2 IMPLANT
TUBE CONNECTING 12X1/4 (SUCTIONS) IMPLANT
YANKAUER SUCT BULB TIP NO VENT (SUCTIONS) IMPLANT

## 2014-12-03 NOTE — Transfer of Care (Signed)
Immediate Anesthesia Transfer of Care Note  Patient: [name]  Procedure(s) Performed: Procedure(s): BREAST LUMPECTOMY WITH RADIOACTIVE SEED LOCALIZATION (Right)  Patient Location: PACU  Anesthesia Type:General  Level of Consciousness: awake, alert  and oriented  Airway & Oxygen Therapy: Patient Spontanous Breathing and Patient connected to nasal cannula oxygen  Post-op Assessment: Report given to RN and Post -op Vital signs reviewed and stable  Post vital signs: Reviewed and stable  Last Vitals:  Filed Vitals:   12/03/14 0806  BP: 130/84  Pulse: 72  Temp: 36.6 C  Resp: 18    Complications: No apparent anesthesia complications

## 2014-12-03 NOTE — H&P (Signed)
[name] 10/28/2014 9:13 AM Location: [facility] Patient #: [id number] DOB: 04-Dec-1955 Married / Language: English / Race: White Female   History of Present Illness [name]([name] S. [name] MD; 10/28/2014 9:38 AM) Patient words: breast f/u.  The patient is a 59 year old female who presents with a breast mass. We are asked to see the patient in consultation by Dr. Frederico HammanMichelle Collins to evaluate her for atypical lobular hyperplasia of the right breast. The patient is a 59 year old white female who recently had a mammogram performed. At that time she had some abnormal appearing calcifications in the lateral right breast. This was biopsied and came back as atypical lobular hyperplasia. Her last mammogram was approximately 8 years ago. She denies any breast pain or discharge from the nipple. She does have a history significant for COPD and is on home oxygen. She also has significant coronary artery disease and is on blood thinners as well.   Other Problems [name]([name], CMA; 10/28/2014 9:13 AM) Anxiety Disorder Arthritis Back Pain Chest pain Chronic Obstructive Lung Disease Congestive Heart Failure High blood pressure Home Oxygen Use Hypercholesterolemia Migraine Headache Myocardial infarction Transfusion history Vascular Disease  Past Surgical History [name]([name], CMA; 10/28/2014 9:13 AM) Breast Biopsy Right. Coronary Artery Bypass Graft Oral Surgery  Diagnostic Studies History [name]([name], CMA; 10/28/2014 9:13 AM) Colonoscopy 1-5 years ago Mammogram within last year Pap Smear >5 years ago  Allergies [name]([name], CMA; 10/28/2014 9:14 AM) Diazepam *ANTIANXIETY AGENTS*  Medication History ([name], CMA; 10/28/2014 9:16 AM) Albuterol Sulfate (4MG  Tablet, Oral) Active. Xanax (1MG  Tablet, Oral) Active. Aspirin (81MG  Tablet Chewable, Oral) Active. BuPROPion HCl (100MG  Tablet, Oral) Active. Plavix (75MG  Tablet, Oral) Active. Breo Ellipta  (200-25MCG/INH Aero Pow Br Act, Inhalation) Active. Metoprolol Succinate ER (50MG  Tablet ER 24HR, Oral) Active. Nitrostat (0.4MG  Tab Sublingual, Sublingual) Active. Crestor (20MG  Tablet, Oral) Active. Medications Reconciled  Social History [name]([name], CMA; 10/28/2014 9:13 AM) No alcohol use No caffeine use No drug use Tobacco use Current every day smoker.  Family History [name]([name], CMA; 10/28/2014 9:13 AM) Anesthetic complications Family Members In General. Arthritis Father, Mother. Breast Cancer Family Members In General. Cervical Cancer Family Members In General. Diabetes Mellitus Brother. Heart Disease Brother, Father, Mother. Hypertension Brother, Son. Melanoma Father. Migraine Headache Mother, Son. Ovarian Cancer Family Members In General. Prostate Cancer Father.  Pregnancy / Birth History [name]([name], CMA; 10/28/2014 9:13 AM) Age at menarche 11 years. Age of menopause <44 Contraceptive History Depo-provera. Gravida 2 Maternal age 60-20 Para 2    Review of Systems [name]([name] CMA; 10/28/2014 9:13 AM) General Present- Fatigue and Weight Gain. Not Present- Appetite Loss, Chills, Fever, Night Sweats and Weight Loss. Skin Present- Dryness. Not Present- Change in Wart/Mole, Hives, Jaundice, New Lesions, Non-Healing Wounds, Rash and Ulcer. HEENT Present- Ringing in the Ears, Seasonal Allergies and Wears glasses/contact lenses. Not Present- Earache, Hearing Loss, Hoarseness, Nose Bleed, Oral Ulcers, Sinus Pain, Sore Throat, Visual Disturbances and Yellow Eyes. Respiratory Present- Chronic Cough, Difficulty Breathing, Snoring and Wheezing. Not Present- Bloody sputum. Breast Present- Breast Pain, Nipple Discharge and Skin Changes. Not Present- Breast Mass. Cardiovascular Present- Shortness of Breath. Not Present- Chest Pain, Difficulty Breathing Lying Down, Leg Cramps, Palpitations, Rapid Heart Rate and Swelling of Extremities. Gastrointestinal  Present- Bloating, Constipation and Excessive gas. Not Present- Abdominal Pain, Bloody Stool, Change in Bowel Habits, Chronic diarrhea, Difficulty Swallowing, Gets full quickly at meals, Hemorrhoids, Indigestion, Nausea, Rectal Pain and Vomiting. Female Genitourinary Present- Frequency and Urgency. Not Present- Nocturia,  Painful Urination and Pelvic Pain. Musculoskeletal Present- Back Pain and Muscle Pain. Not Present- Joint Pain, Joint Stiffness, Muscle Weakness and Swelling of Extremities. Neurological Present- Headaches, Numbness and Tingling. Not Present- Decreased Memory, Fainting, Seizures, Tremor, Trouble walking and Weakness. Psychiatric Present- Anxiety. Not Present- Bipolar, Change in Sleep Pattern, Depression, Fearful and Frequent crying. Endocrine Present- Cold Intolerance, Hair Changes and Hot flashes. Not Present- Excessive Hunger, Heat Intolerance and New Diabetes. Hematology Present- Easy Bruising. Not Present- Excessive bleeding, Gland problems, HIV and Persistent Infections.  Vitals ([name] CMA; 10/28/2014 9:14 AM) 10/28/2014 9:13 AM Weight: 124 lb Height: 63in Body Surface Area: 1.58 m Body Mass Index: 21.97 kg/m  Temp.: 42F(Temporal)  Pulse: 87 (Regular)  BP: 126/76 (Sitting, Left Arm, Standard)     Physical Exam [name] S. Carolynne Edouard MD; 10/28/2014 9:39 AM) General Mental Status-Alert. General Appearance-Consistent with stated age. Hydration-Well hydrated. Voice-Normal.  Head and Neck Head-normocephalic, atraumatic with no lesions or palpable masses. Trachea-midline. Thyroid Gland Characteristics - normal size and consistency.  Eye Eyeball - Bilateral-Extraocular movements intact. Sclera/Conjunctiva - Bilateral-No scleral icterus.  Chest and Lung Exam Chest and lung exam reveals -quiet, even and easy respiratory effort with no use of accessory muscles and on auscultation, normal breath sounds, no adventitious sounds and normal vocal  resonance. Inspection Chest Wall - Normal. Back - normal. Note: She is on home oxygen.   Breast Note: There is a palpable bruising the lateral right breast. Other than this there is no other palpable mass in either breast. There is no palpable axillary, supraclavicular, or cervical lymphadenopathy.   Cardiovascular Cardiovascular examination reveals -normal heart sounds, regular rate and rhythm with no murmurs and normal pedal pulses bilaterally.  Abdomen Inspection Inspection of the abdomen reveals - No Hernias. Skin - Scar - no surgical scars. Palpation/Percussion Palpation and Percussion of the abdomen reveal - Soft, Non Tender, No Rebound tenderness, No Rigidity (guarding) and No hepatosplenomegaly. Auscultation Auscultation of the abdomen reveals - Bowel sounds normal.  Neurologic Neurologic evaluation reveals -alert and oriented x 3 with no impairment of recent or remote memory. Mental Status-Normal.  Musculoskeletal Normal Exam - Left-Upper Extremity Strength Normal and Lower Extremity Strength Normal. Normal Exam - Right-Upper Extremity Strength Normal and Lower Extremity Strength Normal.  Lymphatic Head & Neck  General Head & Neck Lymphatics: Bilateral - Description - Normal. Axillary  General Axillary Region: Bilateral - Description - Normal. Tenderness - Non Tender. Femoral & Inguinal  Generalized Femoral & Inguinal Lymphatics: Bilateral - Description - Normal. Tenderness - Non Tender.    Assessment & Plan [name] S. Carolynne Edouard MD; 10/28/2014 9:37 AM) ATYPICAL LOBULAR HYPERPLASIA OF RIGHT BREAST (N60.91) Impression: The patient appears to have a small area of atypical lobular hyperplasia in the lateral right breast. Because of its abnormal appearance and because it is considered a high risk lesion I would recommend that this area be removed. I have discussed with her in detail the risks and benefits of the operation to remove the area as well as some of the  technical aspects and she understands and wishes to proceed. She would need a right breast radioactive seed localized lumpectomy. Given her history of COPD and coronary artery disease she will need pulmonary and cardiac clearance prior to scheduling surgery. Current Plans Pt Education - Breast Diseases: discussed with patient and provided information.   Signed by Caleen Essex, MD (10/28/2014 9:40 AM)

## 2014-12-03 NOTE — Anesthesia Postprocedure Evaluation (Signed)
Anesthesia Post Note  Patient: Clyda GreenerKendra E Pluta  Procedure(s) Performed: Procedure(s) (LRB): BREAST LUMPECTOMY WITH RADIOACTIVE SEED LOCALIZATION (Right)  Anesthesia type: general  Patient location: PACU  Post pain: Pain level controlled  Post assessment: Patient's Cardiovascular Status Stable  Last Vitals:  Filed Vitals:   12/03/14 1130  BP:   Pulse: 72  Temp: 36.4 C  Resp: 18    Post vital signs: Reviewed and stable  Level of consciousness: sedated  Complications: No apparent anesthesia complications

## 2014-12-03 NOTE — Op Note (Signed)
12/03/2014  10:40 AM  PATIENT:  [name]  59 y.o. female  PRE-OPERATIVE DIAGNOSIS:  RIGHT BREAST ATYPICAL LOBULAR HYPERPLASIA  POST-OPERATIVE DIAGNOSIS:  RIGHT BREAST ATYPICAL LOBULAR HYPERPLASIA  PROCEDURE:  Procedure(s): BREAST LUMPECTOMY WITH RADIOACTIVE SEED LOCALIZATION (Right)  SURGEON:  Surgeon(s) and Role:    * Griselda MinerPaul Toth III, MD - Primary  PHYSICIAN ASSISTANT:   ASSISTANTS: none   ANESTHESIA:   general  EBL:  Total I/O In: 800 [I.V.:800] Out: 10 [Blood:10]  BLOOD ADMINISTERED:none  DRAINS: none   LOCAL MEDICATIONS USED:  MARCAINE     SPECIMEN:  Source of Specimen:  right breast tissue  DISPOSITION OF SPECIMEN:  PATHOLOGY  COUNTS:  YES  TOURNIQUET:  * No tourniquets in log *  DICTATION: .Dragon Dictation   After informed consent was obtained the patient was brought to the operating room and placed in the supine position on the operating room table. After adequate induction of general anesthesia the patient's right breast was prepped with ChloraPrep, allowed to dry, and draped in usual sterile manner. Previously an I-125 seed was placed in the lateral aspect of the right breast to mark an area of atypical lobular hyperplasia. The neoprobe was set to I-125 in the area of radioactivity in the lateral right breast was readily identified. A radially oriented incision was made with a 15 blade knife overlying the area of radioactivity. The incision was carried through the skin and subcutaneous tissue sharply with electrocautery. While checking the area of radioactivity frequently with the neoprobe a circular portion of breast tissue was excised sharply around the radioactive seed. Once the specimen was removed it was oriented with the appropriate paint colors. A specimen radiograph was obtained that showed the clip and seed to be within the specimen. The specimen was then sent to pathology for further evaluation. Hemostasis was achieved using the Bovie electrocautery.  The wound was irrigated with saline and infiltrated with quarter percent Marcaine. The deep layer of the wound was then closed with layers of interrupted 3-0 Vicryl stitches. The skin was then closed with interrupted 4-0 Monocryl subcuticular stitches. Dermabond dressings were applied. The patient tolerated the procedure well. At the end of the case all needle sponge and instrument counts were correct. The patient was then awakened and taken to recovery in stable condition.  PLAN OF CARE: Admit for overnight observation  PATIENT DISPOSITION:  PACU - hemodynamically stable.   Delay start of Pharmacological VTE agent (>24hrs) due to surgical blood loss or risk of bleeding: no

## 2014-12-03 NOTE — Anesthesia Procedure Notes (Signed)
Procedure Name: LMA Insertion Date/Time: 12/03/2014 9:54 AM Performed by: Leonel Ramsay'LAUGHLIN, [name] Pre-anesthesia Checklist: Patient identified, Timeout performed, Emergency Drugs available, Suction available and Patient being monitored Patient Re-evaluated:Patient Re-evaluated prior to inductionOxygen Delivery Method: Circle system utilized Preoxygenation: Pre-oxygenation with 100% oxygen Intubation Type: IV induction LMA: LMA with gastric port inserted LMA Size: 4.0 Number of attempts: 1 Placement Confirmation: positive ETCO2 and breath sounds checked- equal and bilateral Tube secured with: Tape Dental Injury: Teeth and Oropharynx as per pre-operative assessment

## 2014-12-03 NOTE — Interval H&P Note (Signed)
History and Physical Interval Note:  12/03/2014 7:19 AM  Amanda GreenerKendra E Lazalde  has presented today for surgery, with the diagnosis of RIGHT BREAST ATYPICAL LOBULAR HYPERPLASIA  The various methods of treatment have been discussed with the patient and family. After consideration of risks, benefits and other options for treatment, the patient has consented to  Procedure(s): BREAST LUMPECTOMY WITH RADIOACTIVE SEED LOCALIZATION (Right) as a surgical intervention .  The patient's history has been reviewed, patient examined, no change in status, stable for surgery.  I have reviewed the patient's chart and labs.  Questions were answered to the patient's satisfaction.     TOTH III,[name]

## 2014-12-03 NOTE — Anesthesia Preprocedure Evaluation (Addendum)
Anesthesia Evaluation  Patient identified by MRN, date of birth, ID band Patient awake    Reviewed: Allergy & Precautions, NPO status , Patient's Chart, lab work & pertinent test results  History of Anesthesia Complications Negative for: history of anesthetic complications  Airway Mallampati: II  TM Distance: >3 FB Neck ROM: Full    Dental  (+) Poor Dentition, Dental Advisory Given   Pulmonary COPD, Current Smoker,    Pulmonary exam normal        Cardiovascular hypertension, + angina at rest + CAD, + Past MI and + Cardiac Stents  Normal cardiovascular exam     Neuro/Psych  Headaches, PSYCHIATRIC DISORDERS Anxiety negative psych ROS   GI/Hepatic negative GI ROS, Neg liver ROS,   Endo/Other  negative endocrine ROS  Renal/GU negative Renal ROS  negative genitourinary   Musculoskeletal negative musculoskeletal ROS (+)   Abdominal   Peds negative pediatric ROS (+)  Hematology negative hematology ROS (+) JEHOVAH'S WITNESS  Anesthesia Other Findings   Reproductive/Obstetrics negative OB ROS                            Anesthesia Physical Anesthesia Plan  ASA: III  Anesthesia Plan: General   Post-op Pain Management:    Induction: Intravenous  Airway Management Planned: LMA  Additional Equipment:   Intra-op Plan:   Post-operative Plan: Extubation in OR  Informed Consent: I have reviewed the patients History and Physical, chart, labs and discussed the procedure including the risks, benefits and alternatives for the proposed anesthesia with the patient or authorized representative who has indicated his/her understanding and acceptance.   Dental advisory given  Plan Discussed with: CRNA, Anesthesiologist and Surgeon  Anesthesia Plan Comments:         Anesthesia Quick Evaluation

## 2014-12-04 ENCOUNTER — Encounter (HOSPITAL_COMMUNITY): Payer: Self-pay | Admitting: General Surgery

## 2014-12-04 DIAGNOSIS — D0501 Lobular carcinoma in situ of right breast: Secondary | ICD-10-CM | POA: Diagnosis not present

## 2014-12-04 MED ORDER — PANTOPRAZOLE SODIUM 40 MG PO TBEC: 40.0000 mg | DELAYED_RELEASE_TABLET | Freq: Every day | ORAL | Status: DC

## 2014-12-04 MED ORDER — OXYCODONE-ACETAMINOPHEN 5-325 MG PO TABS: 1.0000 | ORAL_TABLET | ORAL | Status: DC | PRN

## 2014-12-04 NOTE — Progress Notes (Signed)
[name] to be D/C'd home per MD order. Discussed with the patient and all questions fully answered.  VSS, Surgical site clean, dry, intact with no sign of infection.  IV catheter discontinued intact. Site without signs and symptoms of complications. Dressing and pressure applied.  An After Visit Summary was printed and given to the patient. Patient received prescription.  D/c education completed with patient/family including follow up instructions, medication list, d/c activities limitations if indicated, with other d/c instructions as indicated by MD - patient able to verbalize understanding, all questions fully answered.   Patient instructed to return to ED, call 911, or call MD for any changes in condition.   Patient to be escorted via WC, and D/C home via private auto.

## 2014-12-04 NOTE — Progress Notes (Signed)
1 Day Post-Op  Subjective: No complaints  Objective: Vital signs in last 24 hours: Temp:  [97.4 F (36.3 C)-98.3 F (36.8 C)] 98.3 F (36.8 C) (11/10 0640) Pulse Rate:  [55-79] 60 (11/10 0640) Resp:  [17-21] 18 (11/10 0640) BP: (100-154)/(64-84) 120/81 mmHg (11/10 0640) SpO2:  [97 %-100 %] 99 % (11/10 0640) Weight:  [56.246 kg (124 lb)] 56.246 kg (124 lb) (11/09 0806) Last BM Date: 12/02/14  Intake/Output from previous day: 11/09 0701 - 11/10 0700 In: 1652 [P.O.:802; I.V.:850] Out: 2810 [Urine:2800; Blood:10] Intake/Output this shift:    Resp: clear to auscultation bilaterally Breasts: incision looks good Cardio: regular rate and rhythm GI: soft, non-tender; bowel sounds normal; no masses,  no organomegaly  Lab Results:  No results for input(s): WBC, HGB, HCT, PLT in the last 72 hours. BMET No results for input(s): NA, K, CL, CO2, GLUCOSE, BUN, CREATININE, CALCIUM in the last 72 hours. PT/INR No results for input(s): LABPROT, INR in the last 72 hours. ABG No results for input(s): PHART, HCO3 in the last 72 hours.  Invalid input(s): PCO2, PO2  Studies/Results: Mm Breast Surgical Specimen  12/03/2014  CLINICAL DATA:  Surgical removal of right breast lesion. EXAM: SPECIMEN RADIOGRAPH OF THE RIGHT BREAST COMPARISON:  Previous exam(s). FINDINGS: Status post excision of the right breast. The radioactive seed and biopsy marker clip are present, completely intact, and were marked for pathology. IMPRESSION: Specimen radiograph of the right breast. Electronically Signed   By: Rolla Plateandolph  Jackson M.D.   On: 12/03/2014 15:46    Anti-infectives: Anti-infectives    Start     Dose/Rate Route Frequency Ordered Stop   12/03/14 0815  ceFAZolin (ANCEF) IVPB 2 g/50 mL premix     2 g 100 mL/hr over 30 Minutes Intravenous To ShortStay Surgical 12/03/14 0800 12/03/14 1025   12/03/14 0805  ceFAZolin (ANCEF) 2-3 GM-% IVPB SOLR    Comments:  Scronce, Trina   : cabinet override      12/03/14  0805 12/03/14 2014      Assessment/Plan: s/p Procedure(s): BREAST LUMPECTOMY WITH RADIOACTIVE SEED LOCALIZATION (Right) Discharge     TOTH III,PAUL S 12/04/2014

## 2014-12-26 ENCOUNTER — Telehealth: Payer: Self-pay | Admitting: Cardiology

## 2014-12-26 NOTE — Telephone Encounter (Signed)
Patient wanted to know what test would show if her right side of her heart is larger than her left side

## 2014-12-26 NOTE — Telephone Encounter (Signed)
Patient advised that an echo is a test that looks at the structures of the heart. Patient verbalized understanding.

## 2015-01-09 ENCOUNTER — Other Ambulatory Visit (HOSPITAL_COMMUNITY): Payer: Self-pay | Admitting: Oncology

## 2015-01-09 DIAGNOSIS — J449 Chronic obstructive pulmonary disease, unspecified: Secondary | ICD-10-CM

## 2015-01-09 DIAGNOSIS — Z87891 Personal history of nicotine dependence: Secondary | ICD-10-CM

## 2015-01-14 ENCOUNTER — Ambulatory Visit (HOSPITAL_COMMUNITY)
Admission: RE | Admit: 2015-01-14 | Discharge: 2015-01-14 | Disposition: A | Discharge status: Home / Self Care | Payer: Managed Care, Other (non HMO) | Source: Ambulatory Visit | Attending: Oncology | Admitting: Oncology

## 2015-01-14 DIAGNOSIS — Z87891 Personal history of nicotine dependence: Secondary | ICD-10-CM

## 2015-01-14 DIAGNOSIS — F17200 Nicotine dependence, unspecified, uncomplicated: Secondary | ICD-10-CM | POA: Insufficient documentation

## 2015-01-14 DIAGNOSIS — Z122 Encounter for screening for malignant neoplasm of respiratory organs: Secondary | ICD-10-CM | POA: Diagnosis not present

## 2015-01-14 DIAGNOSIS — J449 Chronic obstructive pulmonary disease, unspecified: Secondary | ICD-10-CM

## 2015-02-03 ENCOUNTER — Other Ambulatory Visit (HOSPITAL_COMMUNITY): Payer: Self-pay | Admitting: Oncology

## 2015-02-03 DIAGNOSIS — F17200 Nicotine dependence, unspecified, uncomplicated: Secondary | ICD-10-CM

## 2015-02-24 ENCOUNTER — Other Ambulatory Visit: Payer: Self-pay | Admitting: Cardiology

## 2015-05-28 ENCOUNTER — Other Ambulatory Visit: Payer: Self-pay | Admitting: Cardiology

## 2015-07-08 ENCOUNTER — Encounter: Payer: Self-pay | Admitting: Cardiology

## 2015-07-08 ENCOUNTER — Encounter: Payer: Self-pay | Admitting: *Deleted

## 2015-07-08 ENCOUNTER — Ambulatory Visit (INDEPENDENT_AMBULATORY_CARE_PROVIDER_SITE_OTHER): Payer: 59 | Admitting: Cardiology

## 2015-07-08 VITALS — BP 120/90 | HR 98 | Ht 63.0 in | Wt 122.0 lb

## 2015-07-08 DIAGNOSIS — I2510 Atherosclerotic heart disease of native coronary artery without angina pectoris: Secondary | ICD-10-CM

## 2015-07-08 DIAGNOSIS — I10 Essential (primary) hypertension: Secondary | ICD-10-CM

## 2015-07-08 DIAGNOSIS — Z720 Tobacco use: Secondary | ICD-10-CM

## 2015-07-08 DIAGNOSIS — Z5181 Encounter for therapeutic drug level monitoring: Secondary | ICD-10-CM

## 2015-07-08 DIAGNOSIS — E782 Mixed hyperlipidemia: Secondary | ICD-10-CM | POA: Diagnosis not present

## 2015-07-08 NOTE — Progress Notes (Signed)
Cardiology Office Note  Date: 07/08/2015   ID: [name], DOB [birth year], MRN [id number]  PCP: Amanda Specking, MD  Primary Cardiologist: Amanda Dell, MD   Chief Complaint  Patient presents with  . Coronary Artery Disease    History of Present Illness: [name] is a 60 y.o. female last seen in September 2016. She presents for a routine follow-up visit. Reports no definite angina, but did have a discomfort under her left arm the other evening. Has not taken nitroglycerin.  She continues on oxygen with severe COPD. Unfortunately has not been able to quit smoking. She does not have a substantial reactive airway component, has tolerated beta blocker. I reviewed her ECG today which shows sinus rhythm with left atrial enlargement.  Last stress test was in 2012 as outlined below. We discussed arranging a follow-up interval evaluation.  I reviewed her medications which are outlined below. She reports compliance.  Past Medical History  Diagnosis Date  . CAD (coronary artery disease)     DES RCA 2005, multivessel requiring CABG, LVEF 45-50%, occluded SVG to diagonal and occluded sequential limb of SVG to PL of RCA 10/10  . Hyperlipidemia   . Hypertension   . Panic attacks   . Osteoporosis   . Myocardial infarction (HCC) 2005    IMI  . History of influenza   . History of bronchitis   . COPD (chronic obstructive pulmonary disease) (HCC)   . Migraines   . Arthritis   . History of anemia     Past Surgical History  Procedure Laterality Date  . Hand surgery      Left; trigger finger  . Coronary artery bypass graft  2005    LIMA to LAD, SVG to first diagonal, SVG to OM, SVG to RCA and PDA  . Cardiac catheterization    . Colonoscopy    . Breast lumpectomy with radioactive seed localization Right 12/03/2014  . Breast lumpectomy with radioactive seed localization Right 12/03/2014    Procedure: BREAST LUMPECTOMY WITH RADIOACTIVE SEED LOCALIZATION;  Surgeon: [name]  III, MD;  [facility]: [facility];  Service: General;  Laterality: Right;    Current Outpatient Prescriptions  Medication Sig Dispense Refill  . albuterol (PROAIR HFA) 108 (90 BASE) MCG/ACT inhaler Inhale 2 puffs into the lungs every 6 (six) hours as needed for wheezing or shortness of breath.    . ALPRAZolam (XANAX) 1 MG tablet Take 1 mg by mouth every 4 (four) hours as needed for anxiety (panic attacks). Maximum 5 tablets in one day    . aspirin EC 81 MG tablet Take 81 mg by mouth daily.    [name] buPROPion (WELLBUTRIN XL) 150 MG 24 hr tablet Take 150 mg by mouth 2 (two) times daily.    . clopidogrel (PLAVIX) 75 MG tablet TAKE 1 TABLET ONCE DAILY. 30 tablet 6  . Fluticasone Furoate-Vilanterol (BREO ELLIPTA) 200-25 MCG/INH AEPB Inhale 1 puff into the lungs daily.    . metoprolol succinate (TOPROL-XL) 50 MG 24 hr tablet TAKE (1) TABLET BY MOUTH ONCE DAILY. 30 tablet 0  . nitroGLYCERIN (NITROLINGUAL) 0.4 MG/SPRAY spray USE (1) SPRAY UNDER TONGUE EVERY 5 MINUTES AS NEEDED FOR SEVERE CHESTPAIN, NOT TO EXCEED (3) SPRAYS IN A 15 MINUTES TIME FRAME. (Patient taking differently: Place 1 spray under the tongue every 5 (five) minutes x 3 doses as needed for chest pain. Not to exceed (3) sprays in a 15 minute time frame) 12 g 3  . nitroGLYCERIN (NITROSTAT) 0.4  MG SL tablet Place 1 tablet (0.4 mg total) under the tongue every 5 (five) minutes x 3 doses as needed for chest pain. 25 tablet 3  . oxyCODONE-acetaminophen (ROXICET) 5-325 MG tablet Take 1-2 tablets by mouth every 4 (four) hours as needed. 50 tablet 0  . OXYGEN Inhale into the lungs continuous.    . rosuvastatin (CRESTOR) 20 MG tablet TAKE 1 TABLET ONCE DAILY. 30 tablet 3   No current facility-administered medications for this visit.   Allergies:  Diazepam   Social History: The patient  reports that she has been smoking Cigarettes.  She has a 10.75 pack-year smoking history. She has never used smokeless tobacco. She reports that she does not drink alcohol or  use illicit drugs.   Family History: The patient's family history includes Coronary artery disease in her other.   ROS:  Please see the history of present illness. Otherwise, complete review of systems is positive for chronic dyspnea exertion.  All other systems are reviewed and negative.   Physical Exam: VS:  BP 120/90 mmHg  Pulse 98  Ht 5\' 3"  (1.6 m)  Wt 122 lb (55.339 kg)  BMI 21.62 kg/m2  SpO2 98%, BMI Body mass index is 21.62 kg/(m^2).  Wt Readings from Last 3 Encounters:  07/08/15 122 lb (55.339 kg)  12/03/14 124 lb (56.246 kg)  11/28/14 124 lb 1.6 oz (56.291 kg)    Appears comfortable at rest. Wearing oxygen via nasal cannula. HEENT: Conjunctiva and lids normal, oropharynx clear.  Neck: Supple, no carotid bruits, no thyromegaly.  Lungs: Clear to auscultation, nonlabored. No pleural rub.  Cardiac: Regular rate and rhythm, no S3 or pericardial rub.  Abdomen: Soft, nontender. No bruit. Extremity: No pitting edema, distal pulses !+ DP on left and 2+ DP on right.  Musculoskeletal: No kyphosis.  Neuropsychiatric: Alert and oriented x3.   ECG: I personally reviewed the tracing from 10/15/2014 which showed sinus rhythm with nonspecific T-wave changes.  Recent Labwork: 11/28/2014: BUN <5*; Creatinine, Ser 0.47; Hemoglobin 13.4; Platelets 327; Potassium 3.9; Sodium 135   Other Studies Reviewed Today:  Exercise Cardiolite November 2012: No diagnostic ST segment changes, no evidence of scar or ischemia, LVEF 64%.  Abdominal ultrasound 10/22/2014: Aorto-iliac atherosclerosis without stenosis or aneurysmal change.  Lower extremity arterial studies 10/22/2014: Normal ABIs, abnormal great toe-brachial index on the left corresponding with decreased DP.  Assessment and Plan:  1. Multivessel CAD status post CABG in 2005. It has been 5 years since her last stress test, we will arrange a Lexiscan Cardiolite on medical therapy for ischemic surveillance. She does have COPD on  oxygen, however does not have a significant reactive airways component, tolerates beta blocker.  2. Ongoing tobacco use with COPD. Smoking cessation has been discussed, she has had a very difficult time quitting.  3. Hyperlipidemia, on Crestor. She has not had a recent lipid panel. LFTs and FLP will be arranged.  4. Essential hypertension, no changes made present regimen.  Current medicines were reviewed with the patient today.   Orders Placed This Encounter  Procedures  . NM Myocar Multi W/Spect W/Wall Motion / EF  . Lipid panel  . Hepatic function panel  . EKG 12-Lead   Patient Instructions  Your physician recommends that you continue on your current medications as directed. Please refer to the Current Medication list given to you today. Your physician recommends that you return for a FASTING lipid/liver profile as soon as possible. Please make sure you fast at least 8 hours before  having this test. Your physician has requested that you have a lexiscan myoview. For further information please visit [email or web address] Please follow instruction sheet, as given. Your physician recommends that you schedule a follow-up appointment in: 6 months. You will receive a reminder letter in the mail in about 4 months reminding you to call and schedule your appointment. If you don't receive this letter, please contact our office.   Signed, Jonelle Sidle, MD, [facility] 07/08/2015 3:56 PM    [facility] at [facility] [address] [place], [place], [place] [address] Phone: [phone]; Fax: [phone]

## 2015-07-08 NOTE — Patient Instructions (Signed)
Your physician recommends that you continue on your current medications as directed. Please refer to the Current Medication list given to you today. Your physician recommends that you return for a FASTING lipid/liver profile as soon as possible. Please make sure you fast at least 8 hours before having this test. Your physician has requested that you have a lexiscan myoview. For further information please visit [email or web address] Please follow instruction sheet, as given. Your physician recommends that you schedule a follow-up appointment in: 6 months. You will receive a reminder letter in the mail in about 4 months reminding you to call and schedule your appointment. If you don't receive this letter, please contact our office.

## 2015-07-16 ENCOUNTER — Other Ambulatory Visit: Payer: Self-pay | Admitting: Cardiology

## 2015-07-16 MED ORDER — CLOPIDOGREL BISULFATE 75 MG PO TABS: 75.0000 mg | ORAL_TABLET | Freq: Every day | ORAL | Status: DC

## 2015-07-16 MED ORDER — ROSUVASTATIN CALCIUM 20 MG PO TABS: 20.0000 mg | ORAL_TABLET | Freq: Every day | ORAL | Status: DC

## 2015-07-16 NOTE — Telephone Encounter (Signed)
metoprolol succinate (TOPROL-XL) 50 MG 24 hr tablet rosuvastatin (CRESTOR) 20 MG tablet clopidogrel (PLAVIX) 75 MG tablet  [facility]

## 2015-07-20 ENCOUNTER — Ambulatory Visit (HOSPITAL_COMMUNITY)
Admission: RE | Admit: 2015-07-20 | Discharge: 2015-07-20 | Disposition: A | Discharge status: Home / Self Care | Payer: 59 | Source: Ambulatory Visit | Attending: Cardiology | Admitting: Cardiology

## 2015-07-20 ENCOUNTER — Encounter (HOSPITAL_COMMUNITY): Payer: Self-pay

## 2015-07-20 ENCOUNTER — Encounter (HOSPITAL_COMMUNITY)
Admission: RE | Admit: 2015-07-20 | Discharge: 2015-07-20 | Disposition: A | Discharge status: Home / Self Care | Payer: 59 | Source: Ambulatory Visit | Attending: Cardiology | Admitting: Cardiology

## 2015-07-20 DIAGNOSIS — I25119 Atherosclerotic heart disease of native coronary artery with unspecified angina pectoris: Secondary | ICD-10-CM | POA: Diagnosis present

## 2015-07-20 DIAGNOSIS — I2510 Atherosclerotic heart disease of native coronary artery without angina pectoris: Secondary | ICD-10-CM

## 2015-07-20 LAB — NM MYOCAR MULTI W/SPECT W/WALL MOTION / EF
CHL CUP NUCLEAR SRS: 0
CHL CUP NUCLEAR SSS: 4
LHR: 0.64
LV sys vol: 15 mL
LVDIAVOL: 45 mL (ref 46–106)
Peak HR: 117 {beats}/min
Rest HR: 90 {beats}/min
SDS: 4
TID: 1.15

## 2015-07-20 MED ORDER — TECHNETIUM TC 99M TETROFOSMIN IV KIT
10.0000 | PACK | Freq: Once | INTRAVENOUS | Status: AC | PRN
  Administered 2015-07-20: 10 via INTRAVENOUS

## 2015-07-20 MED ORDER — SODIUM CHLORIDE 0.9% FLUSH
INTRAVENOUS | Status: AC
  Administered 2015-07-20: 10 mL via INTRAVENOUS
  Filled 2015-07-20: qty 10

## 2015-07-20 MED ORDER — TECHNETIUM TC 99M TETROFOSMIN IV KIT
30.0000 | PACK | Freq: Once | INTRAVENOUS | Status: AC | PRN
  Administered 2015-07-20: 30 via INTRAVENOUS

## 2015-07-20 MED ORDER — REGADENOSON 0.4 MG/5ML IV SOLN
INTRAVENOUS | Status: AC
  Administered 2015-07-20: 0.4 mg via INTRAVENOUS
  Filled 2015-07-20: qty 5

## 2015-07-21 ENCOUNTER — Telehealth: Payer: Self-pay | Admitting: *Deleted

## 2015-07-21 NOTE — Telephone Encounter (Signed)
-----   Message from Jonelle SidleSamuel G McDowell, MD sent at 07/20/2015 10:07 PM EDT ----- Results reviewed. Low risk study without ischemia. Reassuring results. Continue medical therapy. A copy of this test should be forwarded to Ignatius Speckinghruv B Vyas, MD.

## 2015-07-21 NOTE — Telephone Encounter (Signed)
Patient informed and copy sent to PCP. 

## 2015-08-13 ENCOUNTER — Encounter: Payer: Self-pay | Admitting: *Deleted

## 2015-09-01 ENCOUNTER — Telehealth: Payer: Self-pay | Admitting: *Deleted

## 2015-09-01 NOTE — Telephone Encounter (Signed)
Patient informed. Copy sent to PCP °

## 2015-09-01 NOTE — Telephone Encounter (Signed)
-----   Message from Jonelle SidleSamuel G McDowell, MD sent at 08/26/2015  7:36 AM EDT ----- Results reviewed. LDL cholesterol 87 on Crestor. AST and ALT are normal. No change for now. Note that TSH is elevated, management as per Dr. Sherril CroonVyas. A copy of this test should be forwarded to Ignatius Speckinghruv B Vyas, MD.

## 2015-12-31 ENCOUNTER — Telehealth: Payer: Self-pay | Admitting: Cardiology

## 2015-12-31 NOTE — Telephone Encounter (Signed)
Questioning if clearance was approved for her eye surgery

## 2015-12-31 NOTE — Telephone Encounter (Signed)
Mrs. Cresenciano Genreruitt called wanting to know if anyone had signed off on the surgical clearance for [facility] . Surgery is scheduled for Monday, December 11th, 2018. Patient would like to be called [phone].

## 2016-01-01 NOTE — Telephone Encounter (Signed)
Per schedulers - will see if Joni ReiningKathryn Lawrence, NP will sign clearance

## 2016-01-04 NOTE — Telephone Encounter (Signed)
Per Dahlia ClientHannah in [place], Joni ReiningKathryn Lawrence, NP signed clearance and faxed back to ophthalmologist

## 2016-01-05 ENCOUNTER — Other Ambulatory Visit (HOSPITAL_COMMUNITY): Payer: Self-pay | Admitting: Internal Medicine

## 2016-01-05 DIAGNOSIS — Z9889 Other specified postprocedural states: Secondary | ICD-10-CM

## 2016-01-08 ENCOUNTER — Ambulatory Visit (HOSPITAL_COMMUNITY): Payer: 59

## 2016-01-12 ENCOUNTER — Other Ambulatory Visit (HOSPITAL_COMMUNITY): Payer: Self-pay | Admitting: Internal Medicine

## 2016-01-12 DIAGNOSIS — Z9889 Other specified postprocedural states: Secondary | ICD-10-CM

## 2016-01-19 ENCOUNTER — Ambulatory Visit (HOSPITAL_COMMUNITY)
Admission: RE | Admit: 2016-01-19 | Discharge: 2016-01-19 | Disposition: A | Discharge status: Home / Self Care | Payer: 59 | Source: Ambulatory Visit | Attending: Internal Medicine | Admitting: Internal Medicine

## 2016-01-19 DIAGNOSIS — Z87898 Personal history of other specified conditions: Secondary | ICD-10-CM | POA: Insufficient documentation

## 2016-01-19 DIAGNOSIS — Z9889 Other specified postprocedural states: Secondary | ICD-10-CM

## 2016-02-03 ENCOUNTER — Encounter: Payer: Self-pay | Admitting: *Deleted

## 2016-02-04 ENCOUNTER — Ambulatory Visit (INDEPENDENT_AMBULATORY_CARE_PROVIDER_SITE_OTHER): Payer: 59 | Admitting: Cardiology

## 2016-02-04 ENCOUNTER — Encounter: Payer: Self-pay | Admitting: Cardiology

## 2016-02-04 VITALS — BP 146/83 | HR 89 | Ht 63.0 in | Wt 125.0 lb

## 2016-02-04 DIAGNOSIS — I2510 Atherosclerotic heart disease of native coronary artery without angina pectoris: Secondary | ICD-10-CM

## 2016-02-04 DIAGNOSIS — J449 Chronic obstructive pulmonary disease, unspecified: Secondary | ICD-10-CM | POA: Diagnosis not present

## 2016-02-04 DIAGNOSIS — E782 Mixed hyperlipidemia: Secondary | ICD-10-CM | POA: Diagnosis not present

## 2016-02-04 DIAGNOSIS — F17200 Nicotine dependence, unspecified, uncomplicated: Secondary | ICD-10-CM | POA: Diagnosis not present

## 2016-02-04 NOTE — Patient Instructions (Signed)

## 2016-02-04 NOTE — Progress Notes (Signed)
Cardiology Office Note  Date: 02/04/2016   ID: [name], DOB March 20, 1955, MRN [id number]  PCP: [name], MD  Primary Cardiologist: [name], MD   Chief Complaint  Patient presents with  . Coronary Artery Disease    History of Present Illness: [name] is a 61 y.o. female last seen in June 2017. She presents for a routine follow-up visit. Reports no progressive angina symptoms or nitroglycerin use on current medical regimen. She states that she has cut back to about 5 cigarettes a day. Still uses supplemental oxygen with severe COPD.  Follow-up surveillance Myoview from last year is outlined below, low risk. We went over the results today.  Current cardiac regimen includes aspirin, Plavix, Toprol-XL, Crestor, and as needed nitroglycerin. She reports having recent lab work with Dr. Sherril Croon in December 2017.  Past Medical History:  Diagnosis Date  . Arthritis   . CAD (coronary artery disease)    DES RCA 2005, multivessel requiring CABG, LVEF 45-50%, occluded SVG to diagonal and occluded sequential limb of SVG to PL of RCA 10/10  . COPD (chronic obstructive pulmonary disease) (HCC)   . History of anemia   . History of bronchitis   . History of influenza   . Hyperlipidemia   . Hypertension   . Migraines   . Myocardial infarction 2005   IMI  . Osteoporosis   . Panic attacks     Past Surgical History:  Procedure Laterality Date  . BREAST LUMPECTOMY WITH RADIOACTIVE SEED LOCALIZATION Right 12/03/2014  . BREAST LUMPECTOMY WITH RADIOACTIVE SEED LOCALIZATION Right 12/03/2014   Procedure: BREAST LUMPECTOMY WITH RADIOACTIVE SEED LOCALIZATION;  Surgeon: Chevis Pretty III, MD;  Location: [facility] OR;  Service: General;  Laterality: Right;  . CARDIAC CATHETERIZATION    . COLONOSCOPY    . CORONARY ARTERY BYPASS GRAFT  2005   LIMA to LAD, SVG to first diagonal, SVG to OM, SVG to RCA and PDA  . HAND SURGERY     Left; trigger finger    Current Outpatient Prescriptions    Medication Sig Dispense Refill  . albuterol (PROAIR HFA) 108 (90 BASE) MCG/ACT inhaler Inhale 2 puffs into the lungs every 6 (six) hours as needed for wheezing or shortness of breath.    [name] albuterol (PROVENTIL) (2.5 MG/3ML) 0.083% nebulizer solution Inhale 3 mLs into the lungs 4 (four) times daily as needed.    [name] alendronate (FOSAMAX) 70 MG tablet Take 1 tablet by mouth once a week.    . ALPRAZolam (XANAX) 1 MG tablet Take 1 mg by mouth every 4 (four) hours as needed for anxiety (panic attacks). Maximum 5 tablets in one day    . aspirin EC 81 MG tablet Take 81 mg by mouth daily.    [name] BESIVANCE 0.6 % SUSP Place 2 drops into the right eye 2 (two) times daily.    [name] buPROPion (WELLBUTRIN XL) 300 MG 24 hr tablet Take 150 mg by mouth 2 (two) times daily.    . clopidogrel (PLAVIX) 75 MG tablet Take 1 tablet (75 mg total) by mouth daily. 30 tablet 6  . DUREZOL 0.05 % EMUL Place 1 drop into the right eye daily.    . Fluticasone Furoate-Vilanterol (BREO ELLIPTA) 200-25 MCG/INH AEPB Inhale 1 puff into the lungs daily.    [name] levothyroxine (SYNTHROID, LEVOTHROID) 50 MCG tablet Take 1 tablet by mouth daily.    . meloxicam (MOBIC) 7.5 MG tablet Take 1 tablet by mouth daily.    [name]  metoprolol succinate (TOPROL-XL) 50 MG 24 hr tablet TAKE 1 TABLET ONCE DAILY. 30 tablet 6  . nitroGLYCERIN (NITROLINGUAL) 0.4 MG/SPRAY spray USE (1) SPRAY UNDER TONGUE EVERY 5 MINUTES AS NEEDED FOR SEVERE CHESTPAIN, NOT TO EXCEED (3) SPRAYS IN A 15 MINUTES TIME FRAME. (Patient taking differently: Place 1 spray under the tongue every 5 (five) minutes x 3 doses as needed for chest pain. Not to exceed (3) sprays in a 15 minute time frame) 12 g 3  . nitroGLYCERIN (NITROSTAT) 0.4 MG SL tablet Place 1 tablet (0.4 mg total) under the tongue every 5 (five) minutes x 3 doses as needed for chest pain. 25 tablet 3  . OXYGEN Inhale 2 L into the lungs continuous.     [name] PROLENSA 0.07 % SOLN Place 1 drop into the right eye 2 (two) times daily.    .  rosuvastatin (CRESTOR) 20 MG tablet Take 1 tablet (20 mg total) by mouth daily. 30 tablet 6   No current facility-administered medications for this visit.    Allergies:  Diazepam [valium]   Social History: The patient  reports that she has been smoking Cigarettes.  She started smoking about 46 years ago. She has a 10.75 pack-year smoking history. She has never used smokeless tobacco. She reports that she does not drink alcohol or use drugs.   ROS:  Please see the history of present illness. Otherwise, complete review of systems is positive for NYHA class III dyspnea.  All other systems are reviewed and negative.   Physical Exam: VS:  BP (!) 146/83   Pulse 89   Ht 5\' 3"  (1.6 m)   Wt 125 lb (56.7 kg)   SpO2 99% Comment: on 2L O2 via [place]  BMI 22.14 kg/m , BMI Body mass index is 22.14 kg/m.  Wt Readings from Last 3 Encounters:  02/04/16 125 lb (56.7 kg)  07/08/15 122 lb (55.3 kg)  12/03/14 124 lb (56.2 kg)    Appears comfortable at rest. Wearing oxygen via nasal cannula. HEENT: Conjunctiva and lids normal, oropharynx clear.  Neck: Supple, no carotid bruits, no thyromegaly.  Lungs: Clear to auscultation, nonlabored. No pleural rub.  Cardiac: Regular rate and rhythm, no S3 or pericardial rub.  Abdomen: Soft, nontender. No bruit. Extremity: No pitting edema, distal pulses !+ DP on left and 2+ DP on right.  Musculoskeletal: No kyphosis.  Neuropsychiatric: Alert and oriented x3.  ECG: I personally reviewed the tracing from 07/08/2015 which showed sinus rhythm with left atrial enlargement and nonspecific T-wave changes.  Recent Labwork:  June 2017: Cholesterol 170, triglycerides 1:30, HDL 57, LDL 87, hemoglobin 13.2, platelets 319, TSH 16.65, BUN 3, creatinine 0.6, potassium 5.5, AST 15, ALT 7  Other Studies Reviewed Today:  Lexiscan Myoview 07/20/2015:  There was no ST segment deviation noted during stress.  The study is normal. There are no perfusion defects consistent  with prior infarct or current ischemia.  This is a low risk study.  The left ventricular ejection fraction is hyperdynamic (>65%).  Assessment and Plan:  1. Multivessel CAD status post CABG with documented graft disease. She is symptomatically stable medical therapy. Follow-up Myoview from this past June was low risk.  2. Ongoing tobacco abuse. She has cut back but has not been able to quit.  3. Severe oxygen-dependent COPD followed by Dr. Sherril Croon.  4. Hyperlipidemia, continues on Crestor. Requesting most recent lab work from Dr. Sherril Croon.  Current medicines were reviewed with the patient today.  Disposition: Follow-up in 6 months.  Signed, Jonelle SidleSamuel G. Hervey Wedig, MD, [facility] 02/04/2016 4:18 PM    [facility] [address], [place], [name] [address] Phone: [phone](336) [phone]; Fax: [phone]

## 2016-04-11 ENCOUNTER — Other Ambulatory Visit: Payer: Self-pay | Admitting: Cardiology

## 2016-06-17 ENCOUNTER — Other Ambulatory Visit: Payer: Self-pay | Admitting: Cardiology

## 2016-08-15 ENCOUNTER — Encounter: Payer: Self-pay | Admitting: Cardiology

## 2016-08-15 ENCOUNTER — Ambulatory Visit (INDEPENDENT_AMBULATORY_CARE_PROVIDER_SITE_OTHER): Payer: BLUE CROSS/BLUE SHIELD | Admitting: Cardiology

## 2016-08-15 VITALS — BP 115/71 | HR 87 | Ht 63.0 in | Wt 123.2 lb

## 2016-08-15 DIAGNOSIS — J449 Chronic obstructive pulmonary disease, unspecified: Secondary | ICD-10-CM

## 2016-08-15 DIAGNOSIS — I25119 Atherosclerotic heart disease of native coronary artery with unspecified angina pectoris: Secondary | ICD-10-CM | POA: Diagnosis not present

## 2016-08-15 DIAGNOSIS — Z720 Tobacco use: Secondary | ICD-10-CM

## 2016-08-15 DIAGNOSIS — I10 Essential (primary) hypertension: Secondary | ICD-10-CM

## 2016-08-15 DIAGNOSIS — E782 Mixed hyperlipidemia: Secondary | ICD-10-CM

## 2016-08-15 NOTE — Patient Instructions (Signed)

## 2016-08-15 NOTE — Progress Notes (Signed)
Cardiology Office Note  Date: 08/15/2016   ID: [name], DOB 03-09-1955, MRN [id number]  PCP: [name], MD  Primary Cardiologist: [name], MD   Chief Complaint  Patient presents with  . Coronary Artery Disease    History of Present Illness: [name] is a 61 y.o. female last seen in January. She presents for a routine follow-up visit. Reports no progressive angina symptoms or nitroglycerin use.  Follow-up ischemic testing from June 2017 is outlined below, overall low risk.  She continues on supplemental oxygen with severe COPD, follows with Dr. Sherril Croon.  I personally reviewed her ECG today which shows sinus rhythm with nonspecific T-wave changes.  We went over her cardiac medications which are outlined below. She reports compliance.  She still has not been able to quit smoking.  Past Medical History:  Diagnosis Date  . Arthritis   . CAD (coronary artery disease)    DES RCA 2005, multivessel requiring CABG, LVEF 45-50%, occluded SVG to diagonal and occluded sequential limb of SVG to PL of RCA 10/10  . COPD (chronic obstructive pulmonary disease) (HCC)   . History of anemia   . History of bronchitis   . History of influenza   . Hyperlipidemia   . Hypertension   . Migraines   . Myocardial infarction (HCC) 2005   IMI  . Osteoporosis   . Panic attacks     Past Surgical History:  Procedure Laterality Date  . BREAST LUMPECTOMY WITH RADIOACTIVE SEED LOCALIZATION Right 12/03/2014  . BREAST LUMPECTOMY WITH RADIOACTIVE SEED LOCALIZATION Right 12/03/2014   Procedure: BREAST LUMPECTOMY WITH RADIOACTIVE SEED LOCALIZATION;  Surgeon: Chevis Pretty III, MD;  Location: [facility] OR;  Service: General;  Laterality: Right;  . CARDIAC CATHETERIZATION    . COLONOSCOPY    . CORONARY ARTERY BYPASS GRAFT  2005   LIMA to LAD, SVG to first diagonal, SVG to OM, SVG to RCA and PDA  . HAND SURGERY     Left; trigger finger    Current Outpatient Prescriptions  Medication Sig  Dispense Refill  . albuterol (PROAIR HFA) 108 (90 BASE) MCG/ACT inhaler Inhale 2 puffs into the lungs every 6 (six) hours as needed for wheezing or shortness of breath.    [name] albuterol (PROVENTIL) (2.5 MG/3ML) 0.083% nebulizer solution Inhale 3 mLs into the lungs 4 (four) times daily as needed.    [name] alendronate (FOSAMAX) 70 MG tablet Take 1 tablet by mouth once a week.    . ALPRAZolam (XANAX) 1 MG tablet Take 1 mg by mouth every 4 (four) hours as needed for anxiety (panic attacks). Maximum 5 tablets in one day    . aspirin EC 81 MG tablet Take 81 mg by mouth daily.    [name] buPROPion (WELLBUTRIN XL) 300 MG 24 hr tablet Take 150 mg by mouth daily.     . clopidogrel (PLAVIX) 75 MG tablet TAKE (1) TABLET BY MOUTH ONCE DAILY. 30 tablet 3  . Fluticasone Furoate-Vilanterol (BREO ELLIPTA) 200-25 MCG/INH AEPB Inhale 1 puff into the lungs daily.    [name] levothyroxine (SYNTHROID, LEVOTHROID) 50 MCG tablet Take 1 tablet by mouth daily.    . meloxicam (MOBIC) 7.5 MG tablet Take 1 tablet by mouth daily.    . metoprolol succinate (TOPROL-XL) 50 MG 24 hr tablet TAKE 1 TABLET ONCE DAILY. 30 tablet 6  . nitroGLYCERIN (NITROLINGUAL) 0.4 MG/SPRAY spray USE (1) SPRAY UNDER TONGUE EVERY 5 MINUTES AS NEEDED FOR SEVERE CHESTPAIN, NOT TO EXCEED (3) SPRAYS IN  A 15 MINUTES TIME FRAME. (Patient taking differently: Place 1 spray under the tongue every 5 (five) minutes x 3 doses as needed for chest pain. Not to exceed (3) sprays in a 15 minute time frame) 12 g 3  . nitroGLYCERIN (NITROSTAT) 0.4 MG SL tablet Place 0.4 mg under the tongue every 5 (five) minutes as needed for chest pain.    . OXYGEN Inhale 2 L into the lungs continuous.     . rosuvastatin (CRESTOR) 20 MG tablet TAKE (1) TABLET BY MOUTH ONCE DAILY. 30 tablet 3   No current facility-administered medications for this visit.    Allergies:  Diazepam [valium]   Social History: The patient  reports that she has been smoking Cigarettes.  She started smoking about 46 years  ago. She has a 10.75 pack-year smoking history. She has never used smokeless tobacco. She reports that she does not drink alcohol or use drugs.   ROS:  Please see the history of present illness. Otherwise, complete review of systems is positive for chronic shortness of breath.  All other systems are reviewed and negative.   Physical Exam: VS:  BP 115/71   Pulse 87   Ht 5\' 3"  (1.6 m)   Wt 123 lb 3.2 oz (55.9 kg)   SpO2 98% Comment: on 2L O2 via   BMI 21.82 kg/m , BMI Body mass index is 21.82 kg/m.  Wt Readings from Last 3 Encounters:  08/15/16 123 lb 3.2 oz (55.9 kg)  02/04/16 125 lb (56.7 kg)  07/08/15 122 lb (55.3 kg)    Appears comfortable at rest. Wearing oxygen via nasal cannula. HEENT: Conjunctiva and lids normal, oropharynx clear.  Neck: Supple, no carotid bruits, no thyromegaly.  Lungs: Clear to auscultation, nonlabored. No pleural rub.  Cardiac: Regular rate and rhythm, no S3 or pericardial rub.  Abdomen: Soft, nontender. No bruit. Extremity: No pitting edema.  Musculoskeletal: No kyphosis.  Neuropsychiatric: Alert and oriented x3.  ECG: I personally reviewed the tracing from 07/08/2015 which showed sinus rhythm with left atrial enlargement and nonspecific T-wave changes.  Recent Labwork:  June 2017: Cholesterol 170, triglycerides 130, HDL 57, LDL 87, hemoglobin 13.2, platelets 319, TSH 16.65, BUN 3, creatinine 0.6, potassium 5.5, AST 15, ALT 7  Other Studies Reviewed Today:  Lexiscan Myoview 07/20/2015:  There was no ST segment deviation noted during stress.  The study is normal. There are no perfusion defects consistent with prior infarct or current ischemia.  This is a low risk study.  The left ventricular ejection fraction is hyperdynamic (>65%).  Assessment and Plan:   1. Multivessel CAD status post CABG with graft disease being managed medically. She does not report progressive angina symptoms and had a low risk Myoview study last year. No  changes were made today.  2. Long-standing tobacco abuse with severe oxygen-dependent COPD. She follows with Dr. Sherril CroonVyas.  3. Hyperlipidemia, on Crestor. LDL 87.  4. History of hypertension, blood pressure is normal today.  Current medicines were reviewed with the patient today.   Orders Placed This Encounter  Procedures  . EKG 12-Lead    Disposition: Follow-up in 6 months.  Signed, Jonelle SidleSamuel G. Orson Rho, MD, [facility] 08/15/2016 3:27 PM    [facility] [address], [place], [name] [address] Phone: [phone]([phone]; Fax: [phone]

## 2016-10-18 ENCOUNTER — Other Ambulatory Visit: Payer: Self-pay | Admitting: *Deleted

## 2016-10-18 ENCOUNTER — Other Ambulatory Visit: Payer: Self-pay | Admitting: Cardiology

## 2016-10-18 MED ORDER — ROSUVASTATIN CALCIUM 20 MG PO TABS: 20.0000 mg | ORAL_TABLET | Freq: Every day | ORAL | 6 refills | Status: DC

## 2016-10-27 DIAGNOSIS — F419 Anxiety disorder, unspecified: Secondary | ICD-10-CM | POA: Diagnosis not present

## 2016-11-01 DIAGNOSIS — J449 Chronic obstructive pulmonary disease, unspecified: Secondary | ICD-10-CM | POA: Diagnosis not present

## 2016-11-01 DIAGNOSIS — J209 Acute bronchitis, unspecified: Secondary | ICD-10-CM | POA: Diagnosis not present

## 2016-11-01 DIAGNOSIS — E039 Hypothyroidism, unspecified: Secondary | ICD-10-CM | POA: Diagnosis not present

## 2016-11-01 DIAGNOSIS — I10 Essential (primary) hypertension: Secondary | ICD-10-CM | POA: Diagnosis not present

## 2016-11-01 DIAGNOSIS — Z6821 Body mass index (BMI) 21.0-21.9, adult: Secondary | ICD-10-CM | POA: Diagnosis not present

## 2016-11-01 DIAGNOSIS — Z299 Encounter for prophylactic measures, unspecified: Secondary | ICD-10-CM | POA: Diagnosis not present

## 2016-11-22 ENCOUNTER — Other Ambulatory Visit: Payer: Self-pay | Admitting: Cardiology

## 2016-12-20 ENCOUNTER — Other Ambulatory Visit: Payer: Self-pay | Admitting: Cardiology

## 2016-12-26 DIAGNOSIS — Z299 Encounter for prophylactic measures, unspecified: Secondary | ICD-10-CM | POA: Diagnosis not present

## 2016-12-26 DIAGNOSIS — Z713 Dietary counseling and surveillance: Secondary | ICD-10-CM | POA: Diagnosis not present

## 2016-12-26 DIAGNOSIS — F17210 Nicotine dependence, cigarettes, uncomplicated: Secondary | ICD-10-CM | POA: Diagnosis not present

## 2016-12-26 DIAGNOSIS — M8448XA Pathological fracture, other site, initial encounter for fracture: Secondary | ICD-10-CM | POA: Diagnosis not present

## 2016-12-26 DIAGNOSIS — Z6822 Body mass index (BMI) 22.0-22.9, adult: Secondary | ICD-10-CM | POA: Diagnosis not present

## 2016-12-26 DIAGNOSIS — R05 Cough: Secondary | ICD-10-CM | POA: Diagnosis not present

## 2016-12-26 DIAGNOSIS — R0602 Shortness of breath: Secondary | ICD-10-CM | POA: Diagnosis not present

## 2016-12-26 DIAGNOSIS — S2232XA Fracture of one rib, left side, initial encounter for closed fracture: Secondary | ICD-10-CM | POA: Diagnosis not present

## 2016-12-26 DIAGNOSIS — R0789 Other chest pain: Secondary | ICD-10-CM | POA: Diagnosis not present

## 2017-02-21 DIAGNOSIS — F419 Anxiety disorder, unspecified: Secondary | ICD-10-CM | POA: Diagnosis not present

## 2017-03-20 ENCOUNTER — Other Ambulatory Visit: Payer: Self-pay | Admitting: *Deleted

## 2017-03-20 MED ORDER — CLOPIDOGREL BISULFATE 75 MG PO TABS: 75.0000 mg | ORAL_TABLET | Freq: Every day | ORAL | 2 refills | Status: DC

## 2017-04-07 DIAGNOSIS — I10 Essential (primary) hypertension: Secondary | ICD-10-CM | POA: Diagnosis not present

## 2017-04-07 DIAGNOSIS — Z6822 Body mass index (BMI) 22.0-22.9, adult: Secondary | ICD-10-CM | POA: Diagnosis not present

## 2017-04-07 DIAGNOSIS — Z299 Encounter for prophylactic measures, unspecified: Secondary | ICD-10-CM | POA: Diagnosis not present

## 2017-04-07 DIAGNOSIS — E7800 Pure hypercholesterolemia, unspecified: Secondary | ICD-10-CM | POA: Diagnosis not present

## 2017-04-07 DIAGNOSIS — F17210 Nicotine dependence, cigarettes, uncomplicated: Secondary | ICD-10-CM | POA: Diagnosis not present

## 2017-04-07 DIAGNOSIS — J449 Chronic obstructive pulmonary disease, unspecified: Secondary | ICD-10-CM | POA: Diagnosis not present

## 2017-04-07 DIAGNOSIS — E039 Hypothyroidism, unspecified: Secondary | ICD-10-CM | POA: Diagnosis not present

## 2017-06-05 DIAGNOSIS — F410 Panic disorder [episodic paroxysmal anxiety] without agoraphobia: Secondary | ICD-10-CM | POA: Diagnosis not present

## 2017-06-05 DIAGNOSIS — F419 Anxiety disorder, unspecified: Secondary | ICD-10-CM | POA: Diagnosis not present

## 2017-07-11 DIAGNOSIS — R5383 Other fatigue: Secondary | ICD-10-CM | POA: Diagnosis not present

## 2017-07-11 DIAGNOSIS — Z299 Encounter for prophylactic measures, unspecified: Secondary | ICD-10-CM | POA: Diagnosis not present

## 2017-07-11 DIAGNOSIS — Z7189 Other specified counseling: Secondary | ICD-10-CM | POA: Diagnosis not present

## 2017-07-11 DIAGNOSIS — I10 Essential (primary) hypertension: Secondary | ICD-10-CM | POA: Diagnosis not present

## 2017-07-11 DIAGNOSIS — Z1331 Encounter for screening for depression: Secondary | ICD-10-CM | POA: Diagnosis not present

## 2017-07-11 DIAGNOSIS — Z1339 Encounter for screening examination for other mental health and behavioral disorders: Secondary | ICD-10-CM | POA: Diagnosis not present

## 2017-07-11 DIAGNOSIS — F17210 Nicotine dependence, cigarettes, uncomplicated: Secondary | ICD-10-CM | POA: Diagnosis not present

## 2017-07-11 DIAGNOSIS — Z0000 Encounter for general adult medical examination without abnormal findings: Secondary | ICD-10-CM | POA: Diagnosis not present

## 2017-07-11 DIAGNOSIS — E039 Hypothyroidism, unspecified: Secondary | ICD-10-CM | POA: Diagnosis not present

## 2017-07-11 DIAGNOSIS — Z1211 Encounter for screening for malignant neoplasm of colon: Secondary | ICD-10-CM | POA: Diagnosis not present

## 2017-07-12 DIAGNOSIS — Z79899 Other long term (current) drug therapy: Secondary | ICD-10-CM | POA: Diagnosis not present

## 2017-07-12 DIAGNOSIS — R5383 Other fatigue: Secondary | ICD-10-CM | POA: Diagnosis not present

## 2017-07-12 DIAGNOSIS — E039 Hypothyroidism, unspecified: Secondary | ICD-10-CM | POA: Diagnosis not present

## 2017-07-12 DIAGNOSIS — E7800 Pure hypercholesterolemia, unspecified: Secondary | ICD-10-CM | POA: Diagnosis not present

## 2017-07-27 NOTE — Progress Notes (Signed)
Cardiology Office Note  Date: 07/28/2017   ID: [name], DOB 1955-11-01, MRN [id number]  PCP: [name], MD  Primary Cardiologist: [name], MD   Chief Complaint  Patient presents with  . Coronary Artery Disease    History of Present Illness: [name] is a 62 y.o. female last seen in July 2018.  She is here for a follow-up visit.  She does not report any obvious increasing angina symptoms or nitroglycerin use.  She has atypical, fleeting, sharp chest pains that are sporadic and nonexertional.  No palpitations or syncope.  She continues to use supplemental oxygen for treatment of hypoxic respiratory failure with severe COPD.  Unfortunately, she has not been able to completely stop smoking.  I reviewed her medications.  She reports compliance with cardiac regimen.  She is on aspirin, Plavix, Toprol-XL, Crestor, and as needed nitroglycerin.  We are requesting her most recent lab work from [facility].  I personally reviewed her ECG today which shows a sinus rhythm with lead motion artifact in V1, nonspecific ST-T changes.  Past Medical History:  Diagnosis Date  . Arthritis   . CAD (coronary artery disease)    DES RCA 2005, multivessel requiring CABG, LVEF 45-50%, occluded SVG to diagonal and occluded sequential limb of SVG to PL of RCA 10/10  . COPD (chronic obstructive pulmonary disease) (HCC)   . History of anemia   . History of bronchitis   . History of influenza   . Hyperlipidemia   . Hypertension   . Migraines   . Myocardial infarction (HCC) 2005   IMI  . Osteoporosis   . Panic attacks     Past Surgical History:  Procedure Laterality Date  . BREAST LUMPECTOMY WITH RADIOACTIVE SEED LOCALIZATION Right 12/03/2014  . BREAST LUMPECTOMY WITH RADIOACTIVE SEED LOCALIZATION Right 12/03/2014   Procedure: BREAST LUMPECTOMY WITH RADIOACTIVE SEED LOCALIZATION;  Surgeon: Chevis Pretty III, MD;  Location: [facility] OR;  Service: General;  Laterality: Right;    . CARDIAC CATHETERIZATION    . COLONOSCOPY    . CORONARY ARTERY BYPASS GRAFT  2005   LIMA to LAD, SVG to first diagonal, SVG to OM, SVG to RCA and PDA  . HAND SURGERY     Left; trigger finger    Current Outpatient Medications  Medication Sig Dispense Refill  . albuterol (PROAIR HFA) 108 (90 BASE) MCG/ACT inhaler Inhale 2 puffs into the lungs every 6 (six) hours as needed for wheezing or shortness of breath.    [name] albuterol (PROVENTIL) (2.5 MG/3ML) 0.083% nebulizer solution Inhale 3 mLs into the lungs 4 (four) times daily as needed.    [name] alendronate (FOSAMAX) 70 MG tablet Take 1 tablet by mouth once a week.    . ALPRAZolam (XANAX) 1 MG tablet Take 1 mg by mouth every 4 (four) hours as needed for anxiety (panic attacks). Maximum 5 tablets in one day    . aspirin EC 81 MG tablet Take 81 mg by mouth daily.    [name] buPROPion (WELLBUTRIN XL) 300 MG 24 hr tablet Take 300 mg by mouth daily.     . clopidogrel (PLAVIX) 75 MG tablet Take 1 tablet (75 mg total) by mouth daily. 30 tablet 2  . Fluticasone Furoate-Vilanterol (BREO ELLIPTA) 200-25 MCG/INH AEPB Inhale 1 puff into the lungs daily.    [name] levothyroxine (SYNTHROID, LEVOTHROID) 50 MCG tablet Take 1 tablet by mouth daily.    . meloxicam (MOBIC) 7.5 MG tablet Take 1 tablet by  mouth daily.    . metoprolol succinate (TOPROL-XL) 50 MG 24 hr tablet TAKE 1 TABLET ONCE DAILY. 30 tablet 3  . nitroGLYCERIN (NITROLINGUAL) 0.4 MG/SPRAY spray USE (1) SPRAY UNDER TONGUE EVERY 5 MINUTES AS NEEDED FOR SEVERE CHESTPAIN, NOT TO EXCEED (3) SPRAYS IN A 15 MINUTES TIME FRAME. (Patient taking differently: Place 1 spray under the tongue every 5 (five) minutes x 3 doses as needed for chest pain. Not to exceed (3) sprays in a 15 minute time frame) 12 g 3  . nitroGLYCERIN (NITROSTAT) 0.4 MG SL tablet Place 0.4 mg under the tongue every 5 (five) minutes as needed for chest pain.    . OXYGEN Inhale 2 L into the lungs continuous.     . rosuvastatin (CRESTOR) 20 MG tablet Take 1  tablet (20 mg total) by mouth daily. 30 tablet 6   No current facility-administered medications for this visit.    Allergies:  Diazepam [valium]   Social History: The patient  reports that she has been smoking cigarettes.  She started smoking about 47 years ago. She has a 10.75 pack-year smoking history. She has never used smokeless tobacco. She reports that she does not drink alcohol or use drugs.   ROS:  Please see the history of present illness. Otherwise, complete review of systems is positive for chronic shortness of breath.  All other systems are reviewed and negative.   Physical Exam: VS:  BP (!) 148/87   Pulse 96   Ht 5\' 2"  (1.575 m)   Wt 118 lb 12.8 oz (53.9 kg)   SpO2 95%   BMI 21.73 kg/m , BMI Body mass index is 21.73 kg/m.  Wt Readings from Last 3 Encounters:  07/28/17 118 lb 12.8 oz (53.9 kg)  08/15/16 123 lb 3.2 oz (55.9 kg)  02/04/16 125 lb (56.7 kg)    General: Patient appears comfortable at rest.  Wearing oxygen via nasal cannula. HEENT: Conjunctiva and lids normal, oropharynx clear. Neck: Supple, no elevated JVP or carotid bruits, no thyromegaly. Lungs: Prolonged expiratory phase without wheezing, nonlabored breathing at rest. Cardiac: Regular rate and rhythm, no S3 or significant systolic murmur. Abdomen: Soft, nontender, bowel sounds present. Extremities: No pitting edema, distal pulses 2+. Skin: Warm and dry. Musculoskeletal: No kyphosis. Neuropsychiatric: Alert and oriented x3, affect grossly appropriate.  ECG: I personally reviewed the tracing from 08/15/2016 which showed sinus rhythm with nonspecific T-wave changes.  Recent Labwork:  June 2017: Cholesterol 170, triglycerides 130, HDL 57, LDL 87, hemoglobin 13.2, platelets 319, TSH 16.65, BUN 3, creatinine 0.6, potassium 5.5, AST 15, ALT 7  Other Studies Reviewed Today:  Lexiscan Myoview 07/20/2015:  There was no ST segment deviation noted during stress.  The study is normal. There are no perfusion  defects consistent with prior infarct or current ischemia.  This is a low risk study.  The left ventricular ejection fraction is hyperdynamic (>65%).  Assessment and Plan:  1.  Multivessel CAD status post CABG with documented graft disease.  She reports no progressive angina and we continue with medical therapy.  ECG reviewed.  She underwent ischemic testing within the last 2 years.  No changes were made today.  2.  Mixed hyperlipidemia.  She reports compliance with Crestor.  Requesting recent lab work from Dr. Sherril CroonVyas.  3.  Long-standing tobacco abuse with oxygen dependent COPD.  She continues to follow with Dr. Sherril CroonVyas.  She has not been able to stop smoking.  Current medicines were reviewed with the patient today.   Orders  Placed This Encounter  Procedures  . EKG 12-Lead    Disposition: Follow-up in 6 months.  Signed, Jonelle Sidle, MD, [facility] 07/28/2017 3:53 PM    [facility] at [facility] [address] [place], [place], [place] [address] Phone: [phone]; Fax: [phone]

## 2017-07-28 ENCOUNTER — Encounter: Payer: Self-pay | Admitting: *Deleted

## 2017-07-28 ENCOUNTER — Ambulatory Visit: Payer: PPO | Admitting: Cardiology

## 2017-07-28 ENCOUNTER — Encounter: Payer: Self-pay | Admitting: Cardiology

## 2017-07-28 VITALS — BP 148/87 | HR 96 | Ht 62.0 in | Wt 118.8 lb

## 2017-07-28 DIAGNOSIS — Z720 Tobacco use: Secondary | ICD-10-CM | POA: Diagnosis not present

## 2017-07-28 DIAGNOSIS — E782 Mixed hyperlipidemia: Secondary | ICD-10-CM | POA: Diagnosis not present

## 2017-07-28 DIAGNOSIS — I25119 Atherosclerotic heart disease of native coronary artery with unspecified angina pectoris: Secondary | ICD-10-CM

## 2017-07-28 MED ORDER — NITROGLYCERIN 0.4 MG SL SUBL: 0.4000 mg | SUBLINGUAL_TABLET | SUBLINGUAL | 3 refills | Status: DC | PRN

## 2017-07-28 MED ORDER — METOPROLOL SUCCINATE ER 50 MG PO TB24: 50.0000 mg | ORAL_TABLET | Freq: Every day | ORAL | 6 refills | Status: DC

## 2017-07-28 MED ORDER — ROSUVASTATIN CALCIUM 20 MG PO TABS: 20.0000 mg | ORAL_TABLET | Freq: Every day | ORAL | 6 refills | Status: DC

## 2017-07-28 MED ORDER — CLOPIDOGREL BISULFATE 75 MG PO TABS: 75.0000 mg | ORAL_TABLET | Freq: Every day | ORAL | 6 refills | Status: DC

## 2017-07-28 NOTE — Patient Instructions (Addendum)

## 2017-10-24 DIAGNOSIS — F410 Panic disorder [episodic paroxysmal anxiety] without agoraphobia: Secondary | ICD-10-CM | POA: Diagnosis not present

## 2017-10-24 DIAGNOSIS — F419 Anxiety disorder, unspecified: Secondary | ICD-10-CM | POA: Diagnosis not present

## 2017-10-25 DIAGNOSIS — I10 Essential (primary) hypertension: Secondary | ICD-10-CM | POA: Diagnosis not present

## 2017-10-25 DIAGNOSIS — J449 Chronic obstructive pulmonary disease, unspecified: Secondary | ICD-10-CM | POA: Diagnosis not present

## 2017-10-25 DIAGNOSIS — J441 Chronic obstructive pulmonary disease with (acute) exacerbation: Secondary | ICD-10-CM | POA: Diagnosis not present

## 2017-10-25 DIAGNOSIS — Z299 Encounter for prophylactic measures, unspecified: Secondary | ICD-10-CM | POA: Diagnosis not present

## 2017-10-25 DIAGNOSIS — Z6820 Body mass index (BMI) 20.0-20.9, adult: Secondary | ICD-10-CM | POA: Diagnosis not present

## 2018-01-29 ENCOUNTER — Ambulatory Visit (INDEPENDENT_AMBULATORY_CARE_PROVIDER_SITE_OTHER): Payer: PPO | Admitting: Cardiology

## 2018-01-29 ENCOUNTER — Encounter: Payer: Self-pay | Admitting: Cardiology

## 2018-01-29 VITALS — BP 120/90 | HR 93 | Ht 62.0 in | Wt 103.0 lb

## 2018-01-29 DIAGNOSIS — I25119 Atherosclerotic heart disease of native coronary artery with unspecified angina pectoris: Secondary | ICD-10-CM | POA: Diagnosis not present

## 2018-01-29 DIAGNOSIS — J449 Chronic obstructive pulmonary disease, unspecified: Secondary | ICD-10-CM

## 2018-01-29 DIAGNOSIS — E782 Mixed hyperlipidemia: Secondary | ICD-10-CM | POA: Diagnosis not present

## 2018-01-29 DIAGNOSIS — Z720 Tobacco use: Secondary | ICD-10-CM

## 2018-01-29 MED ORDER — ROSUVASTATIN CALCIUM 20 MG PO TABS: 20.0000 mg | ORAL_TABLET | Freq: Every day | ORAL | 6 refills | Status: DC

## 2018-01-29 MED ORDER — METOPROLOL SUCCINATE ER 50 MG PO TB24: 50.0000 mg | ORAL_TABLET | Freq: Every day | ORAL | 6 refills | Status: DC

## 2018-01-29 MED ORDER — CLOPIDOGREL BISULFATE 75 MG PO TABS: 75.0000 mg | ORAL_TABLET | Freq: Every day | ORAL | 6 refills | Status: DC

## 2018-01-29 NOTE — Patient Instructions (Addendum)

## 2018-01-29 NOTE — Progress Notes (Signed)
Cardiology Office Note  Date: 01/29/2018   ID: [name], DOB 1955-05-28, MRN [id number]  PCP: [name], MD  Primary Cardiologist: Amanda Dell, MD   Chief Complaint  Patient presents with  . Coronary Artery Disease    History of Present Illness: [name] is a 63 y.o. female last seen in July 2019.  She presents for a routine visit.  She does not report any angina symptoms or nitroglycerin use since last assessment.  Still using supplemental oxygen for severe COPD.  Unfortunately, she has not stopped smoking (does not use oxygen when she smokes).  I reviewed her cardiac regimen which is outlined below.  She needed refills for Crestor, Toprol-XL, and Plavix.  Ischemic testing from 2017 was low risk as outlined below, we discussed continuing with observation.  Past Medical History:  Diagnosis Date  . Arthritis   . CAD (coronary artery disease)    DES RCA 2005, multivessel requiring CABG, LVEF 45-50%, occluded SVG to diagonal and occluded sequential limb of SVG to PL of RCA 10/10  . COPD (chronic obstructive pulmonary disease) (HCC)   . History of anemia   . History of bronchitis   . History of influenza   . Hyperlipidemia   . Hypertension   . Migraines   . Myocardial infarction (HCC) 2005   IMI  . Osteoporosis   . Panic attacks     Past Surgical History:  Procedure Laterality Date  . BREAST LUMPECTOMY WITH RADIOACTIVE SEED LOCALIZATION Right 12/03/2014  . BREAST LUMPECTOMY WITH RADIOACTIVE SEED LOCALIZATION Right 12/03/2014   Procedure: BREAST LUMPECTOMY WITH RADIOACTIVE SEED LOCALIZATION;  Surgeon: Chevis Pretty III, MD;  Location: [facility] OR;  Service: General;  Laterality: Right;  . CARDIAC CATHETERIZATION    . COLONOSCOPY    . CORONARY ARTERY BYPASS GRAFT  2005   LIMA to LAD, SVG to first diagonal, SVG to OM, SVG to RCA and PDA  . HAND SURGERY     Left; trigger finger    Current Outpatient Medications  Medication Sig Dispense Refill  . albuterol  (PROAIR HFA) 108 (90 BASE) MCG/ACT inhaler Inhale 2 puffs into the lungs every 6 (six) hours as needed for wheezing or shortness of breath.    [name] albuterol (PROVENTIL) (2.5 MG/3ML) 0.083% nebulizer solution Inhale 3 mLs into the lungs 4 (four) times daily as needed.    [name] alendronate (FOSAMAX) 70 MG tablet Take 1 tablet by mouth once a week.    . ALPRAZolam (XANAX) 1 MG tablet Take 1 mg by mouth every 4 (four) hours as needed for anxiety (panic attacks). Maximum 5 tablets in one day    . aspirin EC 81 MG tablet Take 81 mg by mouth daily.    [name] buPROPion (WELLBUTRIN XL) 300 MG 24 hr tablet Take 300 mg by mouth daily.     . clopidogrel (PLAVIX) 75 MG tablet Take 1 tablet (75 mg total) by mouth daily. 30 tablet 6  . Fluticasone Furoate-Vilanterol (BREO ELLIPTA) 200-25 MCG/INH AEPB Inhale 1 puff into the lungs daily.    [name] levothyroxine (SYNTHROID, LEVOTHROID) 50 MCG tablet Take 1 tablet by mouth daily.    . meloxicam (MOBIC) 7.5 MG tablet Take 1 tablet by mouth daily.    . metoprolol succinate (TOPROL-XL) 50 MG 24 hr tablet Take 1 tablet (50 mg total) by mouth daily. Take with or immediately following a meal. 30 tablet 6  . nitroGLYCERIN (NITROLINGUAL) 0.4 MG/SPRAY spray USE (1) SPRAY UNDER TONGUE EVERY 5  MINUTES AS NEEDED FOR SEVERE CHESTPAIN, NOT TO EXCEED (3) SPRAYS IN A 15 MINUTES TIME FRAME. (Patient taking differently: Place 1 spray under the tongue every 5 (five) minutes x 3 doses as needed for chest pain. Not to exceed (3) sprays in a 15 minute time frame) 12 g 3  . nitroGLYCERIN (NITROSTAT) 0.4 MG SL tablet Place 1 tablet (0.4 mg total) under the tongue every 5 (five) minutes x 3 doses as needed for chest pain (if no relief after 3rd dose, proceed to the ED for an evaluation). 25 tablet 3  . OXYGEN Inhale 2 L into the lungs continuous.     . rosuvastatin (CRESTOR) 20 MG tablet Take 1 tablet (20 mg total) by mouth daily. 30 tablet 6   No current facility-administered medications for this visit.     Allergies:  Diazepam [valium]   Social History: The patient  reports that she has been smoking cigarettes. She started smoking about 48 years ago. She has a 10.75 pack-year smoking history. She has never used smokeless tobacco. She reports that she does not drink alcohol or use drugs.   ROS:  Please see the history of present illness. Otherwise, complete review of systems is positive for chronic shortness of breath.  All other systems are reviewed and negative.   Physical Exam: VS:  BP 120/90   Pulse 93   Ht 5\' 2"  (1.575 m)   Wt 103 lb (46.7 kg)   SpO2 98%   BMI 18.84 kg/m , BMI Body mass index is 18.84 kg/m.  Wt Readings from Last 3 Encounters:  01/29/18 103 lb (46.7 kg)  07/28/17 118 lb 12.8 oz (53.9 kg)  08/15/16 123 lb 3.2 oz (55.9 kg)    General: Patient appears comfortable at rest.  Wearing oxygen via nasal cannula. HEENT: Conjunctiva and lids normal, oropharynx clear. Neck: Supple, no elevated JVP or carotid bruits, no thyromegaly. Lungs: Decreased breath sounds without wheezing, nonlabored breathing at rest. Cardiac: Regular rate and rhythm, no S3 or significant systolic murmur. Abdomen: Soft, nontender, bowel sounds present. Extremities: No pitting edema, distal pulses 2+. Skin: Warm and dry. Musculoskeletal: No kyphosis. Neuropsychiatric: Alert and oriented x3, affect grossly appropriate.  ECG: I personally reviewed the tracing from 07/28/2017 which shows sinus rhythm with lead motion artifact in V1, nonspecific ST-T changes.  Recent Labwork:  June 2019: Hemoglobin 12.5, platelets 299, TSH 10.29, cholesterol 166, triglycerides 71, HDL 61, LDL 91, BUN 5, creatinine 0.6, potassium 5.1, AST 15, ALT 6  Other Studies Reviewed Today:  Lexiscan Myoview 07/20/2015:  There was no ST segment deviation noted during stress.  The study is normal. There are no perfusion defects consistent with prior infarct or current ischemia.  This is a low risk study.  The left  ventricular ejection fraction is hyperdynamic (>65%).  Assessment and Plan:  1.  Multivessel CAD status post CABG with subsequently documented graft disease.  She is symptomatically stable on medical therapy with low risk ischemic testing in 2017.  Continue with observation.  2.  Severe COPD, oxygen dependent with chronic hypoxic respiratory failure.  She follows with Dr. Sherril CroonVyas.  3.  Longstanding tobacco abuse, she has not been able to quit.  4.  Mixed hyperlipidemia on Crestor.  Refill provided.  She continues to see Dr. Sherril CroonVyas.  Last LDL 91.  Current medicines were reviewed with the patient today.  Disposition: Follow-up in 6 months.  Signed, Jonelle SidleSamuel G. Masiyah Jorstad, MD, [facility] 01/29/2018 1:03 PM    [facility]  [facility] [place], [place], [place] [address] Phone: [phone]; Fax: [phone]

## 2018-02-07 DIAGNOSIS — F419 Anxiety disorder, unspecified: Secondary | ICD-10-CM | POA: Diagnosis not present

## 2018-02-07 DIAGNOSIS — F410 Panic disorder [episodic paroxysmal anxiety] without agoraphobia: Secondary | ICD-10-CM | POA: Diagnosis not present

## 2018-04-24 DIAGNOSIS — J441 Chronic obstructive pulmonary disease with (acute) exacerbation: Secondary | ICD-10-CM | POA: Diagnosis not present

## 2018-04-24 DIAGNOSIS — F17210 Nicotine dependence, cigarettes, uncomplicated: Secondary | ICD-10-CM | POA: Diagnosis not present

## 2018-04-24 DIAGNOSIS — J449 Chronic obstructive pulmonary disease, unspecified: Secondary | ICD-10-CM | POA: Diagnosis not present

## 2018-04-24 DIAGNOSIS — Z299 Encounter for prophylactic measures, unspecified: Secondary | ICD-10-CM | POA: Diagnosis not present

## 2018-06-04 DIAGNOSIS — F419 Anxiety disorder, unspecified: Secondary | ICD-10-CM | POA: Diagnosis not present

## 2018-06-04 DIAGNOSIS — F410 Panic disorder [episodic paroxysmal anxiety] without agoraphobia: Secondary | ICD-10-CM | POA: Diagnosis not present

## 2018-07-16 DIAGNOSIS — Z0000 Encounter for general adult medical examination without abnormal findings: Secondary | ICD-10-CM | POA: Diagnosis not present

## 2018-07-16 DIAGNOSIS — Z7189 Other specified counseling: Secondary | ICD-10-CM | POA: Diagnosis not present

## 2018-07-16 DIAGNOSIS — Z1331 Encounter for screening for depression: Secondary | ICD-10-CM | POA: Diagnosis not present

## 2018-07-16 DIAGNOSIS — Z79899 Other long term (current) drug therapy: Secondary | ICD-10-CM | POA: Diagnosis not present

## 2018-07-16 DIAGNOSIS — E7800 Pure hypercholesterolemia, unspecified: Secondary | ICD-10-CM | POA: Diagnosis not present

## 2018-07-16 DIAGNOSIS — I2510 Atherosclerotic heart disease of native coronary artery without angina pectoris: Secondary | ICD-10-CM | POA: Diagnosis not present

## 2018-07-16 DIAGNOSIS — Z1339 Encounter for screening examination for other mental health and behavioral disorders: Secondary | ICD-10-CM | POA: Diagnosis not present

## 2018-07-16 DIAGNOSIS — E039 Hypothyroidism, unspecified: Secondary | ICD-10-CM | POA: Diagnosis not present

## 2018-07-16 DIAGNOSIS — M810 Age-related osteoporosis without current pathological fracture: Secondary | ICD-10-CM | POA: Diagnosis not present

## 2018-07-16 DIAGNOSIS — I10 Essential (primary) hypertension: Secondary | ICD-10-CM | POA: Diagnosis not present

## 2018-07-16 DIAGNOSIS — Z1211 Encounter for screening for malignant neoplasm of colon: Secondary | ICD-10-CM | POA: Diagnosis not present

## 2018-07-16 DIAGNOSIS — Z299 Encounter for prophylactic measures, unspecified: Secondary | ICD-10-CM | POA: Diagnosis not present

## 2018-07-16 DIAGNOSIS — Z681 Body mass index (BMI) 19 or less, adult: Secondary | ICD-10-CM | POA: Diagnosis not present

## 2018-07-30 DIAGNOSIS — J449 Chronic obstructive pulmonary disease, unspecified: Secondary | ICD-10-CM | POA: Diagnosis not present

## 2018-07-30 DIAGNOSIS — Z681 Body mass index (BMI) 19 or less, adult: Secondary | ICD-10-CM | POA: Diagnosis not present

## 2018-07-30 DIAGNOSIS — Z299 Encounter for prophylactic measures, unspecified: Secondary | ICD-10-CM | POA: Diagnosis not present

## 2018-07-30 DIAGNOSIS — Z1231 Encounter for screening mammogram for malignant neoplasm of breast: Secondary | ICD-10-CM | POA: Diagnosis not present

## 2018-07-30 DIAGNOSIS — I2510 Atherosclerotic heart disease of native coronary artery without angina pectoris: Secondary | ICD-10-CM | POA: Diagnosis not present

## 2018-07-30 DIAGNOSIS — I10 Essential (primary) hypertension: Secondary | ICD-10-CM | POA: Diagnosis not present

## 2018-08-24 ENCOUNTER — Other Ambulatory Visit (HOSPITAL_COMMUNITY): Payer: Self-pay | Admitting: Nurse Practitioner

## 2018-08-24 DIAGNOSIS — Z1231 Encounter for screening mammogram for malignant neoplasm of breast: Secondary | ICD-10-CM

## 2018-08-29 ENCOUNTER — Other Ambulatory Visit (HOSPITAL_COMMUNITY): Payer: Self-pay | Admitting: Nurse Practitioner

## 2018-08-29 DIAGNOSIS — Z9889 Other specified postprocedural states: Secondary | ICD-10-CM

## 2018-09-04 ENCOUNTER — Ambulatory Visit (HOSPITAL_COMMUNITY): Admission: RE | Admit: 2018-09-04 | Payer: PPO | Source: Ambulatory Visit

## 2018-09-04 ENCOUNTER — Ambulatory Visit (HOSPITAL_COMMUNITY)
Admission: RE | Admit: 2018-09-04 | Discharge: 2018-09-04 | Disposition: A | Discharge status: Home / Self Care | Payer: PPO | Source: Ambulatory Visit | Attending: Nurse Practitioner | Admitting: Nurse Practitioner

## 2018-09-04 ENCOUNTER — Ambulatory Visit (HOSPITAL_COMMUNITY): Payer: PPO

## 2018-09-04 ENCOUNTER — Other Ambulatory Visit: Payer: Self-pay

## 2018-09-04 DIAGNOSIS — Z9889 Other specified postprocedural states: Secondary | ICD-10-CM | POA: Diagnosis not present

## 2018-09-04 DIAGNOSIS — R928 Other abnormal and inconclusive findings on diagnostic imaging of breast: Secondary | ICD-10-CM | POA: Diagnosis not present

## 2018-09-04 DIAGNOSIS — Z87898 Personal history of other specified conditions: Secondary | ICD-10-CM | POA: Insufficient documentation

## 2018-09-10 DIAGNOSIS — J42 Unspecified chronic bronchitis: Secondary | ICD-10-CM | POA: Diagnosis not present

## 2018-09-10 DIAGNOSIS — Z681 Body mass index (BMI) 19 or less, adult: Secondary | ICD-10-CM | POA: Diagnosis not present

## 2018-09-10 DIAGNOSIS — Z299 Encounter for prophylactic measures, unspecified: Secondary | ICD-10-CM | POA: Diagnosis not present

## 2018-09-10 DIAGNOSIS — E43 Unspecified severe protein-calorie malnutrition: Secondary | ICD-10-CM | POA: Diagnosis not present

## 2018-09-10 DIAGNOSIS — J449 Chronic obstructive pulmonary disease, unspecified: Secondary | ICD-10-CM | POA: Diagnosis not present

## 2018-09-10 DIAGNOSIS — I10 Essential (primary) hypertension: Secondary | ICD-10-CM | POA: Diagnosis not present

## 2018-09-27 DIAGNOSIS — F410 Panic disorder [episodic paroxysmal anxiety] without agoraphobia: Secondary | ICD-10-CM | POA: Diagnosis not present

## 2018-09-27 DIAGNOSIS — F419 Anxiety disorder, unspecified: Secondary | ICD-10-CM | POA: Diagnosis not present

## 2018-10-09 ENCOUNTER — Other Ambulatory Visit: Payer: Self-pay

## 2018-10-09 ENCOUNTER — Encounter: Payer: Self-pay | Admitting: Cardiology

## 2018-10-09 ENCOUNTER — Ambulatory Visit (INDEPENDENT_AMBULATORY_CARE_PROVIDER_SITE_OTHER): Payer: PPO | Admitting: Cardiology

## 2018-10-09 VITALS — BP 136/79 | HR 86 | Ht 62.0 in | Wt 89.6 lb

## 2018-10-09 DIAGNOSIS — E782 Mixed hyperlipidemia: Secondary | ICD-10-CM | POA: Diagnosis not present

## 2018-10-09 DIAGNOSIS — Z720 Tobacco use: Secondary | ICD-10-CM

## 2018-10-09 DIAGNOSIS — J449 Chronic obstructive pulmonary disease, unspecified: Secondary | ICD-10-CM | POA: Diagnosis not present

## 2018-10-09 DIAGNOSIS — I25119 Atherosclerotic heart disease of native coronary artery with unspecified angina pectoris: Secondary | ICD-10-CM

## 2018-10-09 NOTE — Progress Notes (Signed)
Cardiology Office Note  Date: 10/09/2018   ID: Amanda GreenerKendra E Taboada, DOB 06-22-1955, MRN [id number]  PCP:  Ignatius SpeckingVyas, Dhruv B, MD  Cardiologist:  Nona DellSamuel Noah Lembke, MD Electrophysiologist:  None   Chief Complaint  Patient presents with  . Cardiac follow-up    History of Present Illness: [name] is a 63 y.o. female last seen in January.  She presents for a routine follow-up visit.  She has continued to lose weight in the setting of severe COPD with chronic hypoxic respiratory failure on oxygen.  She is following with Dr. Sherril CroonVyas, was placed on appetite stimulant.  She does not report any active angina symptoms or nitroglycerin use.  I personally reviewed her ECG today which shows sinus rhythm with lead motion artifact, nonspecific ST-T changes.  Current cardiac regimen includes aspirin, Plavix, Toprol-XL, as needed nitroglycerin, and Crestor.  We discussed medication compliance.  Past Medical History:  Diagnosis Date  . Arthritis   . CAD (coronary artery disease)    DES RCA 2005, multivessel requiring CABG, LVEF 45-50%, occluded SVG to diagonal and occluded sequential limb of SVG to PL of RCA 10/10  . COPD (chronic obstructive pulmonary disease) (HCC)   . History of anemia   . History of bronchitis   . History of influenza   . Hyperlipidemia   . Hypertension   . Migraines   . Myocardial infarction (HCC) 2005   IMI  . Osteoporosis   . Panic attacks     Past Surgical History:  Procedure Laterality Date  . BREAST LUMPECTOMY WITH RADIOACTIVE SEED LOCALIZATION Right 12/03/2014  . BREAST LUMPECTOMY WITH RADIOACTIVE SEED LOCALIZATION Right 12/03/2014   Procedure: BREAST LUMPECTOMY WITH RADIOACTIVE SEED LOCALIZATION;  Surgeon: Chevis PrettyPaul Toth III, MD;  Location: [facility] OR;  Service: General;  Laterality: Right;  . CARDIAC CATHETERIZATION    . COLONOSCOPY    . CORONARY ARTERY BYPASS GRAFT  2005   LIMA to LAD, SVG to first diagonal, SVG to OM, SVG to RCA and PDA  . HAND SURGERY     Left;  trigger finger    Current Outpatient Medications  Medication Sig Dispense Refill  . albuterol (PROAIR HFA) 108 (90 BASE) MCG/ACT inhaler Inhale 2 puffs into the lungs every 6 (six) hours as needed for wheezing or shortness of breath.    [name]. albuterol (PROVENTIL) (2.5 MG/3ML) 0.083% nebulizer solution Inhale 3 mLs into the lungs 4 (four) times daily as needed.    [name]. alendronate (FOSAMAX) 70 MG tablet Take 1 tablet by mouth once a week.    . ALPRAZolam (XANAX) 1 MG tablet Take 1 mg by mouth every 4 (four) hours as needed for anxiety (panic attacks). Maximum 5 tablets in one day    . aspirin EC 81 MG tablet Take 81 mg by mouth daily.    [name]. buPROPion (WELLBUTRIN XL) 300 MG 24 hr tablet Take 300 mg by mouth daily.     . clopidogrel (PLAVIX) 75 MG tablet Take 1 tablet (75 mg total) by mouth daily. 30 tablet 6  . Fluticasone Furoate-Vilanterol (BREO ELLIPTA) 200-25 MCG/INH AEPB Inhale 1 puff into the lungs daily.    . meloxicam (MOBIC) 7.5 MG tablet Take 1 tablet by mouth daily.    . metoprolol succinate (TOPROL-XL) 50 MG 24 hr tablet Take 1 tablet (50 mg total) by mouth daily. Take with or immediately following a meal. 30 tablet 6  . mirtazapine (REMERON) 7.5 MG tablet Take 1 tablet by mouth at bedtime.    .[name]  nitroGLYCERIN (NITROLINGUAL) 0.4 MG/SPRAY spray USE (1) SPRAY UNDER TONGUE EVERY 5 MINUTES AS NEEDED FOR SEVERE CHESTPAIN, NOT TO EXCEED (3) SPRAYS IN A [place]. (Patient taking differently: Place 1 spray under the tongue every 5 (five) minutes x 3 doses as needed for chest pain. Not to exceed (3) sprays in a 15 minute time frame) 12 g 3  . nitroGLYCERIN (NITROSTAT) 0.4 MG SL tablet Place 1 tablet (0.4 mg total) under the tongue every 5 (five) minutes x 3 doses as needed for chest pain (if no relief after 3rd dose, proceed to the ED for an evaluation). 25 tablet 3  . OXYGEN Inhale 2 L into the lungs continuous.     . rosuvastatin (CRESTOR) 20 MG tablet Take 1 tablet (20 mg total) by mouth  daily. 30 tablet 6  . Vitamin D, Ergocalciferol, (DRISDOL) 1.25 MG (50000 UT) CAPS capsule Take 50,000 Units by mouth 2 (two) times a week.    . levothyroxine (SYNTHROID, LEVOTHROID) 50 MCG tablet Take 1 tablet by mouth daily.     No current facility-administered medications for this visit.    Allergies:  Diazepam [valium]   Social History: The patient  reports that she has been smoking cigarettes. She started smoking about 48 years ago. She has a 10.75 pack-year smoking history. She has never used smokeless tobacco. She reports that she does not drink alcohol or use drugs.   ROS:  Please see the history of present illness. Otherwise, complete review of systems is positive for anorexia.  All other systems are reviewed and negative.   Physical Exam: VS:  BP 136/79   Pulse 86   Ht 5\' 2"  (1.575 m)   Wt 89 lb 9.6 oz (40.6 kg)   SpO2 98%   BMI 16.39 kg/m , BMI Body mass index is 16.39 kg/m.  Wt Readings from Last 3 Encounters:  10/09/18 89 lb 9.6 oz (40.6 kg)  01/29/18 103 lb (46.7 kg)  07/28/17 118 lb 12.8 oz (53.9 kg)    General: Underweight, chronically ill-appearing woman wearing oxygen via nasal cannula. HEENT: Conjunctiva and lids normal, wearing a mask.   Neck: Supple, no elevated JVP or carotid bruits, no thyromegaly. Lungs: Decreased breath sounds with prolonged expiratory phase, nonlabored breathing at rest. Cardiac: Regular rate and rhythm, no S3 or significant systolic murmur, no pericardial rub. Abdomen: Soft, nontender, bowel sounds present. Extremities: No pitting edema, distal pulses 2+. Skin: Warm and dry. Musculoskeletal: No kyphosis. Neuropsychiatric: Alert and oriented x3, affect grossly appropriate.  ECG:  An ECG dated 07/28/2017 was personally reviewed today and demonstrated:  Sinus rhythm with lead motion artifact in V1, nonspecific ST-T changes.  Recent Labwork:  June 2019: Hemoglobin 12.5, platelets 299, TSH 10.29, cholesterol 166, triglycerides 71, HDL 61,  LDL 91, BUN 5, creatinine 0.6, potassium 5.1, AST 15, ALT 6  Other Studies Reviewed Today:  Lexiscan Myoview 07/20/2015:  There was no ST segment deviation noted during stress.  The study is normal. There are no perfusion defects consistent with prior infarct or current ischemia.  This is a low risk study.  The left ventricular ejection fraction is hyperdynamic (>65%).  Assessment and Plan:  1.  Multivessel CAD status post CABG with subsequently documented graft disease and is being managed medically.  No changes made to current regimen in the absence of progressive angina.  ECG reviewed.  2.  Severe COPD with chronic hypoxic respiratory failure on oxygen.  She follows with Dr. Woody Seller.  3.  Longstanding  tobacco abuse.  She has not been motivated to quit despite her chronic medical illnesses.  4.  Mixed hyperlipidemia on Crestor.  Keep follow-up with Dr. Sherril Croon.  Medication Adjustments/Labs and Tests Ordered: Current medicines are reviewed at length with the patient today.  Concerns regarding medicines are outlined above.   Tests Ordered: Orders Placed This Encounter  Procedures  . EKG 12-Lead    Medication Changes: No orders of the defined types were placed in this encounter.   Disposition:  Follow up 6 months in the [place] office.  Signed, Jonelle Sidle, MD, [facility] 10/09/2018 5:06 PM    [facility] at [facility] [address] [place], [place], [place] [address] Phone: [phone]; Fax: [phone]

## 2018-10-09 NOTE — Patient Instructions (Addendum)

## 2018-10-15 DIAGNOSIS — I10 Essential (primary) hypertension: Secondary | ICD-10-CM | POA: Diagnosis not present

## 2018-10-15 DIAGNOSIS — I2510 Atherosclerotic heart disease of native coronary artery without angina pectoris: Secondary | ICD-10-CM | POA: Diagnosis not present

## 2018-10-15 DIAGNOSIS — J449 Chronic obstructive pulmonary disease, unspecified: Secondary | ICD-10-CM | POA: Diagnosis not present

## 2018-10-15 DIAGNOSIS — J441 Chronic obstructive pulmonary disease with (acute) exacerbation: Secondary | ICD-10-CM | POA: Diagnosis not present

## 2018-10-15 DIAGNOSIS — Z951 Presence of aortocoronary bypass graft: Secondary | ICD-10-CM | POA: Diagnosis not present

## 2018-10-15 DIAGNOSIS — R05 Cough: Secondary | ICD-10-CM | POA: Diagnosis not present

## 2018-10-15 DIAGNOSIS — J42 Unspecified chronic bronchitis: Secondary | ICD-10-CM | POA: Diagnosis not present

## 2018-10-15 DIAGNOSIS — Z681 Body mass index (BMI) 19 or less, adult: Secondary | ICD-10-CM | POA: Diagnosis not present

## 2018-10-15 DIAGNOSIS — Z299 Encounter for prophylactic measures, unspecified: Secondary | ICD-10-CM | POA: Diagnosis not present

## 2018-11-12 DIAGNOSIS — E43 Unspecified severe protein-calorie malnutrition: Secondary | ICD-10-CM | POA: Diagnosis not present

## 2018-11-12 DIAGNOSIS — J449 Chronic obstructive pulmonary disease, unspecified: Secondary | ICD-10-CM | POA: Diagnosis not present

## 2018-11-12 DIAGNOSIS — Z681 Body mass index (BMI) 19 or less, adult: Secondary | ICD-10-CM | POA: Diagnosis not present

## 2018-11-12 DIAGNOSIS — I10 Essential (primary) hypertension: Secondary | ICD-10-CM | POA: Diagnosis not present

## 2018-11-12 DIAGNOSIS — Z299 Encounter for prophylactic measures, unspecified: Secondary | ICD-10-CM | POA: Diagnosis not present

## 2018-11-12 DIAGNOSIS — I2510 Atherosclerotic heart disease of native coronary artery without angina pectoris: Secondary | ICD-10-CM | POA: Diagnosis not present

## 2018-12-08 ENCOUNTER — Other Ambulatory Visit: Payer: Self-pay | Admitting: Cardiology

## 2019-01-11 DIAGNOSIS — E43 Unspecified severe protein-calorie malnutrition: Secondary | ICD-10-CM | POA: Diagnosis not present

## 2019-01-11 DIAGNOSIS — F322 Major depressive disorder, single episode, severe without psychotic features: Secondary | ICD-10-CM | POA: Diagnosis not present

## 2019-01-11 DIAGNOSIS — Z299 Encounter for prophylactic measures, unspecified: Secondary | ICD-10-CM | POA: Diagnosis not present

## 2019-01-11 DIAGNOSIS — J9611 Chronic respiratory failure with hypoxia: Secondary | ICD-10-CM | POA: Diagnosis not present

## 2019-01-11 DIAGNOSIS — I10 Essential (primary) hypertension: Secondary | ICD-10-CM | POA: Diagnosis not present

## 2019-01-11 DIAGNOSIS — J449 Chronic obstructive pulmonary disease, unspecified: Secondary | ICD-10-CM | POA: Diagnosis not present

## 2019-01-11 DIAGNOSIS — Z681 Body mass index (BMI) 19 or less, adult: Secondary | ICD-10-CM | POA: Diagnosis not present

## 2019-01-31 DIAGNOSIS — F419 Anxiety disorder, unspecified: Secondary | ICD-10-CM | POA: Diagnosis not present

## 2019-01-31 DIAGNOSIS — F410 Panic disorder [episodic paroxysmal anxiety] without agoraphobia: Secondary | ICD-10-CM | POA: Diagnosis not present

## 2019-03-24 DIAGNOSIS — J441 Chronic obstructive pulmonary disease with (acute) exacerbation: Secondary | ICD-10-CM | POA: Diagnosis not present

## 2019-03-24 DIAGNOSIS — J42 Unspecified chronic bronchitis: Secondary | ICD-10-CM | POA: Diagnosis not present

## 2019-04-12 DIAGNOSIS — J9611 Chronic respiratory failure with hypoxia: Secondary | ICD-10-CM | POA: Diagnosis not present

## 2019-04-12 DIAGNOSIS — Z299 Encounter for prophylactic measures, unspecified: Secondary | ICD-10-CM | POA: Diagnosis not present

## 2019-04-12 DIAGNOSIS — I10 Essential (primary) hypertension: Secondary | ICD-10-CM | POA: Diagnosis not present

## 2019-04-12 DIAGNOSIS — E43 Unspecified severe protein-calorie malnutrition: Secondary | ICD-10-CM | POA: Diagnosis not present

## 2019-04-12 DIAGNOSIS — J449 Chronic obstructive pulmonary disease, unspecified: Secondary | ICD-10-CM | POA: Diagnosis not present

## 2019-04-12 DIAGNOSIS — Z681 Body mass index (BMI) 19 or less, adult: Secondary | ICD-10-CM | POA: Diagnosis not present

## 2019-04-12 DIAGNOSIS — F419 Anxiety disorder, unspecified: Secondary | ICD-10-CM | POA: Diagnosis not present

## 2019-04-26 DIAGNOSIS — F13239 Sedative, hypnotic or anxiolytic dependence with withdrawal, unspecified: Secondary | ICD-10-CM | POA: Diagnosis not present

## 2019-04-26 DIAGNOSIS — R202 Paresthesia of skin: Secondary | ICD-10-CM | POA: Diagnosis not present

## 2019-04-29 ENCOUNTER — Ambulatory Visit: Payer: PPO | Admitting: Cardiology

## 2019-04-29 DIAGNOSIS — F419 Anxiety disorder, unspecified: Secondary | ICD-10-CM | POA: Diagnosis not present

## 2019-04-29 DIAGNOSIS — F410 Panic disorder [episodic paroxysmal anxiety] without agoraphobia: Secondary | ICD-10-CM | POA: Diagnosis not present

## 2019-05-03 ENCOUNTER — Ambulatory Visit: Payer: PPO | Admitting: Cardiology

## 2019-05-22 DIAGNOSIS — Z299 Encounter for prophylactic measures, unspecified: Secondary | ICD-10-CM | POA: Diagnosis not present

## 2019-05-22 DIAGNOSIS — I10 Essential (primary) hypertension: Secondary | ICD-10-CM | POA: Diagnosis not present

## 2019-05-22 DIAGNOSIS — F322 Major depressive disorder, single episode, severe without psychotic features: Secondary | ICD-10-CM | POA: Diagnosis not present

## 2019-05-22 DIAGNOSIS — N6311 Unspecified lump in the right breast, upper outer quadrant: Secondary | ICD-10-CM | POA: Diagnosis not present

## 2019-05-22 DIAGNOSIS — J441 Chronic obstructive pulmonary disease with (acute) exacerbation: Secondary | ICD-10-CM | POA: Diagnosis not present

## 2019-05-24 DIAGNOSIS — R42 Dizziness and giddiness: Secondary | ICD-10-CM | POA: Diagnosis not present

## 2019-05-24 DIAGNOSIS — J441 Chronic obstructive pulmonary disease with (acute) exacerbation: Secondary | ICD-10-CM | POA: Diagnosis not present

## 2019-05-27 NOTE — Progress Notes (Signed)
Cardiology Office Note  Date: 05/28/2019   ID: [name], DOB December 06, 1955, MRN [id number]  PCP:  [name], MD  Cardiologist:  [name], MD Electrophysiologist:  None   Chief Complaint  Patient presents with  . Cardiac follow-up    History of Present Illness: [name] is a 64 y.o. female last seen in September 2020.  She presents for a routine visit.  Since last assessment she does not report any definite angina symptoms or nitroglycerin use.  Still wearing oxygen continuously with end-stage COPD.  She reports regular follow-up with Dr. Sherril Croon and anticipates a physical with lab work in June.  I went over her medications which are outlined below and stable from a cardiac perspective.  We are providing refills for Toprol-XL, Crestor, and Plavix.  Also for as needed nitroglycerin.  ECG from September 2020 is noted below.  She states that she has had both doses of the coronavirus vaccine.  Past Medical History:  Diagnosis Date  . Arthritis   . CAD (coronary artery disease)    DES RCA 2005, multivessel requiring CABG, LVEF 45-50%, occluded SVG to diagonal and occluded sequential limb of SVG to PL of RCA 10/10  . COPD (chronic obstructive pulmonary disease) (HCC)   . History of anemia   . History of bronchitis   . History of influenza   . Hyperlipidemia   . Hypertension   . Migraines   . Myocardial infarction (HCC) 2005   IMI  . Osteoporosis   . Panic attacks     Past Surgical History:  Procedure Laterality Date  . BREAST LUMPECTOMY WITH RADIOACTIVE SEED LOCALIZATION Right 12/03/2014  . BREAST LUMPECTOMY WITH RADIOACTIVE SEED LOCALIZATION Right 12/03/2014   Procedure: BREAST LUMPECTOMY WITH RADIOACTIVE SEED LOCALIZATION;  Surgeon: Chevis Pretty III, MD;  Location: [facility];  Service: General;  Laterality: Right;  . CARDIAC CATHETERIZATION    . COLONOSCOPY    . CORONARY ARTERY BYPASS GRAFT  2005   LIMA to LAD, SVG to first diagonal, SVG to OM, SVG to RCA  and PDA  . HAND SURGERY     Left; trigger finger    Current Outpatient Medications  Medication Sig Dispense Refill  . albuterol (PROAIR HFA) 108 (90 BASE) MCG/ACT inhaler Inhale 2 puffs into the lungs every 6 (six) hours as needed for wheezing or shortness of breath.    [name] albuterol (PROVENTIL) (2.5 MG/3ML) 0.083% nebulizer solution Inhale 3 mLs into the lungs 4 (four) times daily as needed.    [name] alendronate (FOSAMAX) 70 MG tablet Take 1 tablet by mouth once a week.    . ALPRAZolam (XANAX) 1 MG tablet Take 1 mg by mouth every 4 (four) hours as needed for anxiety (panic attacks). Maximum 5 tablets in one day    . aspirin EC 81 MG tablet Take 81 mg by mouth daily.    [name] buPROPion (WELLBUTRIN XL) 300 MG 24 hr tablet Take 300 mg by mouth daily.     . clopidogrel (PLAVIX) 75 MG tablet Take 1 tablet (75 mg total) by mouth daily. 90 tablet 3  . levothyroxine (SYNTHROID) 125 MCG tablet Take 125 mcg by mouth daily.    . meloxicam (MOBIC) 7.5 MG tablet Take 1 tablet by mouth daily.    . metoprolol succinate (TOPROL-XL) 50 MG 24 hr tablet Take 1 tablet (50 mg total) by mouth daily. Take with or immediately following a meal. 90 tablet 3  . mirtazapine (REMERON) 7.5 MG tablet Take  1 tablet by mouth at bedtime.    . nitroGLYCERIN (NITROLINGUAL) 0.4 MG/SPRAY spray USE (1) SPRAY UNDER TONGUE EVERY 5 MINUTES AS NEEDED FOR SEVERE CHESTPAIN, NOT TO EXCEED (3) SPRAYS IN A [place]. 12 g 3  . nitroGLYCERIN (NITROSTAT) 0.4 MG SL tablet Place 1 tablet (0.4 mg total) under the tongue every 5 (five) minutes x 3 doses as needed for chest pain (if no relief after 3rd dose, proceed to the ED for an evaluation). 25 tablet 3  . OXYGEN Inhale 2 L into the lungs continuous.     . rosuvastatin (CRESTOR) 20 MG tablet Take 1 tablet (20 mg total) by mouth daily. 90 tablet 3  . TRELEGY ELLIPTA 100-62.5-25 MCG/INH AEPB Inhale 1 puff into the lungs daily.    . Vitamin D, Ergocalciferol, (DRISDOL) 1.25 MG (50000 UT) CAPS  capsule Take 50,000 Units by mouth 2 (two) times a week.     No current facility-administered medications for this visit.   Allergies:  Diazepam [valium]   ROS:  Chronic dyspnea on exertion, no recent change.  No cough, fevers or chills.  Physical Exam: VS:  BP 122/74   Pulse 95   Temp 99.8 F (37.7 C)   Ht 5\' 2"  (1.575 m)   Wt 91 lb 12.8 oz (41.6 kg)   SpO2 96%   BMI 16.79 kg/m , BMI Body mass index is 16.79 kg/m.  Wt Readings from Last 3 Encounters:  05/28/19 91 lb 12.8 oz (41.6 kg)  10/09/18 89 lb 9.6 oz (40.6 kg)  01/29/18 103 lb (46.7 kg)    General: Chronically ill-appearing woman, no distress, wearing oxygen via nasal cannula. HEENT: Conjunctiva and lids normal, wearing a mask. Neck: Supple, no elevated JVP or carotid bruits, no thyromegaly. Lungs: Decreased breath sounds throughout with no active wheezing. Cardiac: Regular rate and rhythm, no S3 or significant systolic murmur. Extremities: No pitting edema.  ECG:  An ECG dated 10/09/2018 was personally reviewed today and demonstrated:  Sinus rhythm with lead motion artifact, nonspecific ST-T changes.  Recent Labwork:  No interval lab work for review today.  Last LDL was 91 in June 2019.  Other Studies Reviewed Today:  Lexiscan Myoview 07/20/2015:  There was no ST segment deviation noted during stress.  The study is normal. There are no perfusion defects consistent with prior infarct or current ischemia.  This is a low risk study.  The left ventricular ejection fraction is hyperdynamic (>65%).  Assessment and Plan:  1.  Multivessel CAD status post CABG with graft disease that is being managed medically.  She does not report any progressive angina symptoms or nitroglycerin use.  Plan to continue observation.  Continue aspirin, Plavix, Toprol-XL, Crestor, and as needed nitroglycerin.  Refills provided.  Check ECG for next visit.  2.  Mixed hyperlipidemia, she remains on Crestor and will be having lab work with  PCP in June.  Medication Adjustments/Labs and Tests Ordered: Current medicines are reviewed at length with the patient today.  Concerns regarding medicines are outlined above.   Tests Ordered: No orders of the defined types were placed in this encounter.   Medication Changes: Meds ordered this encounter  Medications  . clopidogrel (PLAVIX) 75 MG tablet    Sig: Take 1 tablet (75 mg total) by mouth daily.    Dispense:  90 tablet    Refill:  3  . rosuvastatin (CRESTOR) 20 MG tablet    Sig: Take 1 tablet (20 mg total) by mouth daily.  Dispense:  90 tablet    Refill:  3  . metoprolol succinate (TOPROL-XL) 50 MG 24 hr tablet    Sig: Take 1 tablet (50 mg total) by mouth daily. Take with or immediately following a meal.    Dispense:  90 tablet    Refill:  3  . nitroGLYCERIN (NITROLINGUAL) 0.4 MG/SPRAY spray    Sig: USE (1) SPRAY UNDER TONGUE EVERY 5 MINUTES AS NEEDED FOR SEVERE CHESTPAIN, NOT TO EXCEED (3) SPRAYS IN A 15 MINUTES TIME FRAME.    Dispense:  12 g    Refill:  3    Disposition:  Follow up 6 months in the [place] office.  Signed, Jonelle Sidle, MD, [facility] 05/28/2019 2:27 PM    [facility] at [facility] [address] [place], [place] [address] Phone: [phone]; Fax: [phone]

## 2019-05-28 ENCOUNTER — Encounter: Payer: Self-pay | Admitting: Cardiology

## 2019-05-28 ENCOUNTER — Other Ambulatory Visit: Payer: Self-pay

## 2019-05-28 ENCOUNTER — Ambulatory Visit: Payer: PPO | Admitting: Cardiology

## 2019-05-28 VITALS — BP 122/74 | HR 95 | Temp 99.8°F | Ht 62.0 in | Wt 91.8 lb

## 2019-05-28 DIAGNOSIS — E782 Mixed hyperlipidemia: Secondary | ICD-10-CM

## 2019-05-28 DIAGNOSIS — I25119 Atherosclerotic heart disease of native coronary artery with unspecified angina pectoris: Secondary | ICD-10-CM | POA: Diagnosis not present

## 2019-05-28 DIAGNOSIS — J449 Chronic obstructive pulmonary disease, unspecified: Secondary | ICD-10-CM | POA: Diagnosis not present

## 2019-05-28 MED ORDER — CLOPIDOGREL BISULFATE 75 MG PO TABS: 75.0000 mg | ORAL_TABLET | Freq: Every day | ORAL | 3 refills | Status: DC

## 2019-05-28 MED ORDER — ROSUVASTATIN CALCIUM 20 MG PO TABS: 20.0000 mg | ORAL_TABLET | Freq: Every day | ORAL | 3 refills | Status: DC

## 2019-05-28 MED ORDER — METOPROLOL SUCCINATE ER 50 MG PO TB24: 50.0000 mg | ORAL_TABLET | Freq: Every day | ORAL | 3 refills | Status: DC

## 2019-05-28 MED ORDER — NITROGLYCERIN 0.4 MG/SPRAY TL SOLN: 3 refills | Status: DC

## 2019-05-28 NOTE — Patient Instructions (Signed)
Medication Instructions: Your physician recommends that you continue on your current medications as directed. Please refer to the Current Medication list given to you today.   Labwork: None today  Procedures/Testing: None today  Follow-Up: 6 months office visit with Dr.McDowell in the [name] office  Any Additional Special Instructions Will Be Listed Below (If Applicable).     If you need a refill on your cardiac medications before your next appointment, please call your pharmacy.      Thank you for choosing [facility] !

## 2019-05-29 ENCOUNTER — Other Ambulatory Visit: Payer: Self-pay

## 2019-05-29 DIAGNOSIS — R928 Other abnormal and inconclusive findings on diagnostic imaging of breast: Secondary | ICD-10-CM | POA: Diagnosis not present

## 2019-05-29 DIAGNOSIS — N6001 Solitary cyst of right breast: Secondary | ICD-10-CM | POA: Diagnosis not present

## 2019-05-29 MED ORDER — NITROGLYCERIN 0.4 MG SL SUBL: 0.4000 mg | SUBLINGUAL_TABLET | SUBLINGUAL | 3 refills | Status: DC | PRN

## 2019-05-29 NOTE — Telephone Encounter (Signed)
Pt prefer nitro tablets instead of spray per [employer].    Thanks renee

## 2019-05-29 NOTE — Telephone Encounter (Signed)
Refill for nito tablets sent in to [facility] pharmacy

## 2019-06-04 DIAGNOSIS — J441 Chronic obstructive pulmonary disease with (acute) exacerbation: Secondary | ICD-10-CM | POA: Diagnosis not present

## 2019-06-04 DIAGNOSIS — Z299 Encounter for prophylactic measures, unspecified: Secondary | ICD-10-CM | POA: Diagnosis not present

## 2019-06-04 DIAGNOSIS — J449 Chronic obstructive pulmonary disease, unspecified: Secondary | ICD-10-CM | POA: Diagnosis not present

## 2019-06-04 DIAGNOSIS — I10 Essential (primary) hypertension: Secondary | ICD-10-CM | POA: Diagnosis not present

## 2019-07-24 DIAGNOSIS — J449 Chronic obstructive pulmonary disease, unspecified: Secondary | ICD-10-CM | POA: Diagnosis not present

## 2019-07-24 DIAGNOSIS — I10 Essential (primary) hypertension: Secondary | ICD-10-CM | POA: Diagnosis not present

## 2019-07-30 DIAGNOSIS — J449 Chronic obstructive pulmonary disease, unspecified: Secondary | ICD-10-CM | POA: Diagnosis not present

## 2019-07-30 DIAGNOSIS — F322 Major depressive disorder, single episode, severe without psychotic features: Secondary | ICD-10-CM | POA: Diagnosis not present

## 2019-07-30 DIAGNOSIS — I10 Essential (primary) hypertension: Secondary | ICD-10-CM | POA: Diagnosis not present

## 2019-07-30 DIAGNOSIS — Z299 Encounter for prophylactic measures, unspecified: Secondary | ICD-10-CM | POA: Diagnosis not present

## 2019-07-30 DIAGNOSIS — F17210 Nicotine dependence, cigarettes, uncomplicated: Secondary | ICD-10-CM | POA: Diagnosis not present

## 2019-07-30 DIAGNOSIS — F13239 Sedative, hypnotic or anxiolytic dependence with withdrawal, unspecified: Secondary | ICD-10-CM | POA: Diagnosis not present

## 2019-08-01 DIAGNOSIS — F410 Panic disorder [episodic paroxysmal anxiety] without agoraphobia: Secondary | ICD-10-CM | POA: Diagnosis not present

## 2019-08-01 DIAGNOSIS — F419 Anxiety disorder, unspecified: Secondary | ICD-10-CM | POA: Diagnosis not present

## 2019-09-03 DIAGNOSIS — Z299 Encounter for prophylactic measures, unspecified: Secondary | ICD-10-CM | POA: Diagnosis not present

## 2019-09-03 DIAGNOSIS — Z1331 Encounter for screening for depression: Secondary | ICD-10-CM | POA: Diagnosis not present

## 2019-09-03 DIAGNOSIS — Z0000 Encounter for general adult medical examination without abnormal findings: Secondary | ICD-10-CM | POA: Diagnosis not present

## 2019-09-03 DIAGNOSIS — I10 Essential (primary) hypertension: Secondary | ICD-10-CM | POA: Diagnosis not present

## 2019-09-03 DIAGNOSIS — Z79899 Other long term (current) drug therapy: Secondary | ICD-10-CM | POA: Diagnosis not present

## 2019-09-03 DIAGNOSIS — Z681 Body mass index (BMI) 19 or less, adult: Secondary | ICD-10-CM | POA: Diagnosis not present

## 2019-09-03 DIAGNOSIS — E039 Hypothyroidism, unspecified: Secondary | ICD-10-CM | POA: Diagnosis not present

## 2019-09-03 DIAGNOSIS — Z1339 Encounter for screening examination for other mental health and behavioral disorders: Secondary | ICD-10-CM | POA: Diagnosis not present

## 2019-09-03 DIAGNOSIS — E559 Vitamin D deficiency, unspecified: Secondary | ICD-10-CM | POA: Diagnosis not present

## 2019-09-03 DIAGNOSIS — Z7189 Other specified counseling: Secondary | ICD-10-CM | POA: Diagnosis not present

## 2019-09-03 DIAGNOSIS — E7800 Pure hypercholesterolemia, unspecified: Secondary | ICD-10-CM | POA: Diagnosis not present

## 2019-09-03 DIAGNOSIS — R5383 Other fatigue: Secondary | ICD-10-CM | POA: Diagnosis not present

## 2019-09-10 DIAGNOSIS — J301 Allergic rhinitis due to pollen: Secondary | ICD-10-CM | POA: Diagnosis not present

## 2019-09-24 DIAGNOSIS — J449 Chronic obstructive pulmonary disease, unspecified: Secondary | ICD-10-CM | POA: Diagnosis not present

## 2019-09-24 DIAGNOSIS — I10 Essential (primary) hypertension: Secondary | ICD-10-CM | POA: Diagnosis not present

## 2019-10-18 DIAGNOSIS — F419 Anxiety disorder, unspecified: Secondary | ICD-10-CM | POA: Diagnosis not present

## 2019-10-18 DIAGNOSIS — F410 Panic disorder [episodic paroxysmal anxiety] without agoraphobia: Secondary | ICD-10-CM | POA: Diagnosis not present

## 2019-10-24 DIAGNOSIS — J449 Chronic obstructive pulmonary disease, unspecified: Secondary | ICD-10-CM | POA: Diagnosis not present

## 2019-10-24 DIAGNOSIS — I10 Essential (primary) hypertension: Secondary | ICD-10-CM | POA: Diagnosis not present

## 2019-10-25 ENCOUNTER — Telehealth: Payer: Self-pay | Admitting: Internal Medicine

## 2019-10-25 NOTE — Telephone Encounter (Signed)
Called patient to remind of appt with Dr Diona Browner on Mon 10/28/19 at 2:20pm.  Patient confirmed she will be there.

## 2019-10-28 ENCOUNTER — Ambulatory Visit (INDEPENDENT_AMBULATORY_CARE_PROVIDER_SITE_OTHER): Payer: PPO | Admitting: Cardiology

## 2019-10-28 ENCOUNTER — Encounter: Payer: Self-pay | Admitting: *Deleted

## 2019-10-28 ENCOUNTER — Encounter: Payer: Self-pay | Admitting: Cardiology

## 2019-10-28 VITALS — BP 114/70 | HR 94 | Ht 62.0 in | Wt 92.2 lb

## 2019-10-28 DIAGNOSIS — J449 Chronic obstructive pulmonary disease, unspecified: Secondary | ICD-10-CM | POA: Diagnosis not present

## 2019-10-28 DIAGNOSIS — I25119 Atherosclerotic heart disease of native coronary artery with unspecified angina pectoris: Secondary | ICD-10-CM

## 2019-10-28 DIAGNOSIS — E782 Mixed hyperlipidemia: Secondary | ICD-10-CM

## 2019-10-28 NOTE — Addendum Note (Signed)
Addended by: Eustace Moore on: 10/28/2019 03:28 PM   Modules accepted: Orders

## 2019-10-28 NOTE — Patient Instructions (Addendum)

## 2019-10-28 NOTE — Progress Notes (Signed)
Cardiology Office Note  Date: 10/28/2019   ID: [name], DOB 1955-12-16, MRN [id number]  PCP:  Amanda Specking, MD  Cardiologist:  Amanda Dell, MD Electrophysiologist:  None   Chief Complaint  Patient presents with  . Cardiac follow-up    History of Present Illness: [name] is a 64 y.o. female last seen in May.  She presents for a follow-up visit.  She does not describe any angina symptoms or nitroglycerin use.  She continues on supplemental oxygen with end-stage COPD, still functional with ADLs but does have to pace herself.  She has not lost any more weight, reports a stable appetite, still uses Ensure.  I personally reviewed her ECG today which shows sinus rhythm with nonspecific ST changes, rightward axis.  We went over her cardiac medications which are listed below.  She states that she had been forgetting to take her evening Crestor at times, we discussed moving this to a morning medication.  Also requesting interval lab work from [name].  Past Medical History:  Diagnosis Date  . Arthritis   . CAD (coronary artery disease)    DES RCA 2005, multivessel requiring CABG, LVEF 45-50%, occluded SVG to diagonal and occluded sequential limb of SVG to PL of RCA 10/10  . COPD (chronic obstructive pulmonary disease) (HCC)   . History of anemia   . History of bronchitis   . History of influenza   . Hyperlipidemia   . Hypertension   . Migraines   . Myocardial infarction (HCC) 2005   IMI  . Osteoporosis   . Panic attacks     Past Surgical History:  Procedure Laterality Date  . BREAST LUMPECTOMY WITH RADIOACTIVE SEED LOCALIZATION Right 12/03/2014  . BREAST LUMPECTOMY WITH RADIOACTIVE SEED LOCALIZATION Right 12/03/2014   Procedure: BREAST LUMPECTOMY WITH RADIOACTIVE SEED LOCALIZATION;  Surgeon: Chevis Pretty III, MD;  Location: [facility] OR;  Service: General;  Laterality: Right;  . CARDIAC CATHETERIZATION    . COLONOSCOPY    . CORONARY ARTERY BYPASS GRAFT  2005    LIMA to LAD, SVG to first diagonal, SVG to OM, SVG to RCA and PDA  . HAND SURGERY     Left; trigger finger    Current Outpatient Medications  Medication Sig Dispense Refill  . albuterol (PROAIR HFA) 108 (90 BASE) MCG/ACT inhaler Inhale 2 puffs into the lungs every 6 (six) hours as needed for wheezing or shortness of breath.    [name] albuterol (PROVENTIL) (2.5 MG/3ML) 0.083% nebulizer solution Inhale 3 mLs into the lungs 4 (four) times daily as needed.    [name] alendronate (FOSAMAX) 70 MG tablet Take 1 tablet by mouth once a week.    . ALPRAZolam (XANAX) 1 MG tablet Take 1 mg by mouth every 4 (four) hours as needed for anxiety (panic attacks). Maximum 5 tablets in one day    . aspirin EC 81 MG tablet Take 81 mg by mouth daily.    [name] buPROPion (WELLBUTRIN XL) 300 MG 24 hr tablet Take 300 mg by mouth daily.     . clopidogrel (PLAVIX) 75 MG tablet Take 1 tablet (75 mg total) by mouth daily. 90 tablet 3  . levothyroxine (SYNTHROID) 125 MCG tablet Take 125 mcg by mouth daily.    . meloxicam (MOBIC) 7.5 MG tablet Take 1 tablet by mouth daily.    . metoprolol succinate (TOPROL-XL) 50 MG 24 hr tablet Take 1 tablet (50 mg total) by mouth daily. Take with or immediately following a meal.  90 tablet 3  . mirtazapine (REMERON) 7.5 MG tablet Take 1 tablet by mouth at bedtime.    . nitroGLYCERIN (NITROSTAT) 0.4 MG SL tablet Place 1 tablet (0.4 mg total) under the tongue every 5 (five) minutes x 3 doses as needed for chest pain (if no relief after 3rd dose, proceed to the ED for an evaluation). 25 tablet 3  . OXYGEN Inhale 2 L into the lungs continuous.     . rosuvastatin (CRESTOR) 20 MG tablet Take 1 tablet (20 mg total) by mouth daily. 90 tablet 3  . TRELEGY ELLIPTA 100-62.5-25 MCG/INH AEPB Inhale 1 puff into the lungs daily.    . Vitamin D, Ergocalciferol, (DRISDOL) 1.25 MG (50000 UT) CAPS capsule Take 50,000 Units by mouth 2 (two) times a week.     No current facility-administered medications for this visit.    Allergies:  Diazepam [valium]   ROS:   No syncope.  Physical Exam: VS:  BP 114/70   Pulse 94   Ht 5\' 2"  (1.575 m)   Wt 92 lb 3.2 oz (41.8 kg)   SpO2 98% Comment: on 2L O2 via [place]  BMI 16.86 kg/m , BMI Body mass index is 16.86 kg/m.  Wt Readings from Last 3 Encounters:  10/28/19 92 lb 3.2 oz (41.8 kg)  05/28/19 91 lb 12.8 oz (41.6 kg)  10/09/18 89 lb 9.6 oz (40.6 kg)    General: No distress, wearing supplemental oxygen via nasal cannula. HEENT: Conjunctiva and lids normal, wearing a mask. Neck: Supple, no elevated JVP or carotid bruits, no thyromegaly. Lungs: Decreased breath sounds throughout, no wheezing. Cardiac: Regular rate and rhythm, no S3 or significant systolic murmur. Extremities: No pitting edema.  ECG:  An ECG dated 10/09/2018 was personally reviewed today and demonstrated:  Sinus rhythm with lead motion artifact, nonspecific ST-T changes.  Recent Labwork:  No interval lab work for review today.   Other Studies Reviewed Today:  Lexiscan Myoview 07/20/2015:  There was no ST segment deviation noted during stress.  The study is normal. There are no perfusion defects consistent with prior infarct or current ischemia.  This is a low risk study.  The left ventricular ejection fraction is hyperdynamic (>65%).  Assessment and Plan:  1.  CAD status post CABG with medically managed graft disease.  No progressive angina on medical therapy.  ECG reviewed.  Continue aspirin, Plavix, Toprol-XL, and Crestor.  As needed nitroglycerin is available.  2.  Mixed hyperlipidemia, requesting interval lab work from Dr. 07/22/2015.  She will move Crestor to her morning medications since she has been forgetting the evening dose at times.  3.  End-stage COPD, continues on supplemental oxygen with follow-up by [name].  Medication Adjustments/Labs and Tests Ordered: Current medicines are reviewed at length with the patient today.  Concerns regarding medicines are outlined above.    Tests Ordered: No orders of the defined types were placed in this encounter.   Medication Changes: No orders of the defined types were placed in this encounter.   Disposition:  Follow up 6 months in the office.  Signed, Sherril Croon, MD, [facility] 10/28/2019 3:02 PM    [facility] at [facility] [address] [place], [place], [place] Phone: [phone]; Fax: [phone]

## 2019-12-04 DIAGNOSIS — J209 Acute bronchitis, unspecified: Secondary | ICD-10-CM | POA: Diagnosis not present

## 2019-12-04 DIAGNOSIS — F17210 Nicotine dependence, cigarettes, uncomplicated: Secondary | ICD-10-CM | POA: Diagnosis not present

## 2019-12-04 DIAGNOSIS — F322 Major depressive disorder, single episode, severe without psychotic features: Secondary | ICD-10-CM | POA: Diagnosis not present

## 2019-12-04 DIAGNOSIS — I10 Essential (primary) hypertension: Secondary | ICD-10-CM | POA: Diagnosis not present

## 2019-12-04 DIAGNOSIS — J449 Chronic obstructive pulmonary disease, unspecified: Secondary | ICD-10-CM | POA: Diagnosis not present

## 2019-12-04 DIAGNOSIS — Z299 Encounter for prophylactic measures, unspecified: Secondary | ICD-10-CM | POA: Diagnosis not present

## 2019-12-04 DIAGNOSIS — J440 Chronic obstructive pulmonary disease with acute lower respiratory infection: Secondary | ICD-10-CM | POA: Diagnosis not present

## 2019-12-04 DIAGNOSIS — Z23 Encounter for immunization: Secondary | ICD-10-CM | POA: Diagnosis not present

## 2019-12-24 DIAGNOSIS — E039 Hypothyroidism, unspecified: Secondary | ICD-10-CM | POA: Diagnosis not present

## 2019-12-24 DIAGNOSIS — I10 Essential (primary) hypertension: Secondary | ICD-10-CM | POA: Diagnosis not present

## 2020-01-09 DIAGNOSIS — F410 Panic disorder [episodic paroxysmal anxiety] without agoraphobia: Secondary | ICD-10-CM | POA: Diagnosis not present

## 2020-01-09 DIAGNOSIS — F419 Anxiety disorder, unspecified: Secondary | ICD-10-CM | POA: Diagnosis not present

## 2020-01-24 DIAGNOSIS — E039 Hypothyroidism, unspecified: Secondary | ICD-10-CM | POA: Diagnosis not present

## 2020-01-24 DIAGNOSIS — I10 Essential (primary) hypertension: Secondary | ICD-10-CM | POA: Diagnosis not present

## 2020-02-24 DIAGNOSIS — I10 Essential (primary) hypertension: Secondary | ICD-10-CM | POA: Diagnosis not present

## 2020-02-24 DIAGNOSIS — E039 Hypothyroidism, unspecified: Secondary | ICD-10-CM | POA: Diagnosis not present

## 2020-03-10 DIAGNOSIS — J449 Chronic obstructive pulmonary disease, unspecified: Secondary | ICD-10-CM | POA: Diagnosis not present

## 2020-03-10 DIAGNOSIS — F17210 Nicotine dependence, cigarettes, uncomplicated: Secondary | ICD-10-CM | POA: Diagnosis not present

## 2020-03-10 DIAGNOSIS — J9611 Chronic respiratory failure with hypoxia: Secondary | ICD-10-CM | POA: Diagnosis not present

## 2020-03-10 DIAGNOSIS — I10 Essential (primary) hypertension: Secondary | ICD-10-CM | POA: Diagnosis not present

## 2020-03-10 DIAGNOSIS — J42 Unspecified chronic bronchitis: Secondary | ICD-10-CM | POA: Diagnosis not present

## 2020-03-10 DIAGNOSIS — F13239 Sedative, hypnotic or anxiolytic dependence with withdrawal, unspecified: Secondary | ICD-10-CM | POA: Diagnosis not present

## 2020-03-10 DIAGNOSIS — Z299 Encounter for prophylactic measures, unspecified: Secondary | ICD-10-CM | POA: Diagnosis not present

## 2020-03-23 DIAGNOSIS — E039 Hypothyroidism, unspecified: Secondary | ICD-10-CM | POA: Diagnosis not present

## 2020-03-23 DIAGNOSIS — I10 Essential (primary) hypertension: Secondary | ICD-10-CM | POA: Diagnosis not present

## 2020-04-09 DIAGNOSIS — J449 Chronic obstructive pulmonary disease, unspecified: Secondary | ICD-10-CM | POA: Diagnosis not present

## 2020-04-10 DIAGNOSIS — F410 Panic disorder [episodic paroxysmal anxiety] without agoraphobia: Secondary | ICD-10-CM | POA: Diagnosis not present

## 2020-04-10 DIAGNOSIS — F419 Anxiety disorder, unspecified: Secondary | ICD-10-CM | POA: Diagnosis not present

## 2020-05-04 ENCOUNTER — Other Ambulatory Visit: Payer: Self-pay | Admitting: Cardiology

## 2020-05-11 DIAGNOSIS — I10 Essential (primary) hypertension: Secondary | ICD-10-CM | POA: Diagnosis not present

## 2020-05-11 DIAGNOSIS — I2510 Atherosclerotic heart disease of native coronary artery without angina pectoris: Secondary | ICD-10-CM | POA: Diagnosis not present

## 2020-05-11 DIAGNOSIS — E43 Unspecified severe protein-calorie malnutrition: Secondary | ICD-10-CM | POA: Diagnosis not present

## 2020-05-11 DIAGNOSIS — F13239 Sedative, hypnotic or anxiolytic dependence with withdrawal, unspecified: Secondary | ICD-10-CM | POA: Diagnosis not present

## 2020-05-11 DIAGNOSIS — Z299 Encounter for prophylactic measures, unspecified: Secondary | ICD-10-CM | POA: Diagnosis not present

## 2020-05-11 DIAGNOSIS — J449 Chronic obstructive pulmonary disease, unspecified: Secondary | ICD-10-CM | POA: Diagnosis not present

## 2020-05-13 ENCOUNTER — Ambulatory Visit: Payer: PPO | Admitting: Cardiology

## 2020-05-23 DIAGNOSIS — E039 Hypothyroidism, unspecified: Secondary | ICD-10-CM | POA: Diagnosis not present

## 2020-05-23 DIAGNOSIS — I10 Essential (primary) hypertension: Secondary | ICD-10-CM | POA: Diagnosis not present

## 2020-05-27 NOTE — Progress Notes (Signed)
Cardiology Office Note  Date: 05/28/2020   ID: [name], DOB [birth year], MRN [id number]  PCP:  [name], MD  Cardiologist:  [name], MD Electrophysiologist:  None   Chief Complaint: 77-month follow-up  History of Present Illness: [name] is a 65 y.o. female with a history of CAD, COPD, HLD, HTN, MI, tobacco abuse.   Last seen by Dr. Diona Browner 10/28/2019.  She presented for follow-up visit.  She denies any anginal symptoms or nitroglycerin use.  She was continuing supplemental oxygen with end-stage COPD.  She was still functional with ADLs but had to pace herself.  She has not lost any more weight.  Reports stable appetite.  She was continuing aspirin, Plavix, Toprol-XL, Crestor.  Continuing as needed nitroglycerin.  Interval lab work was requested from PCP.  She was continuing on supplemental O2 with follow-up by PCP for end-stage COPD.  He is here today for 19-month follow-up.  She denies any recent acute illnesses or hospitalizations.  She is on continuous O2 nasal.  O2 saturations today on arrival 95%.  Blood pressure well controlled on current medical therapy.  Since October of last year she has lost approximately 6 pound based on her weight today of 86 versus 92 at last visit.  She states she ran out of her Ensure protein drinks for a while.  She denies any anginal symptoms.  No palpitations or arrhythmias.  No orthostatic symptoms.  No PND, orthopnea, no bleeding.  No claudication-like symptoms, DVT or PE-like symptoms.  No lower extremity edema.  She continues her current cardiac regimen consisting of aspirin 81 mg, Plavix 75 mg, Toprol XL 50 mg daily.  Sublingual nitroglycerin as needed, Crestor 20 mg p.o. daily.  Denies any bleeding issues.   Past Medical History:  Diagnosis Date  . Arthritis   . CAD (coronary artery disease)    DES RCA 2005, multivessel requiring CABG, LVEF 45-50%, occluded SVG to diagonal and occluded sequential limb of SVG to PL of RCA  10/10  . COPD (chronic obstructive pulmonary disease) (HCC)   . History of anemia   . History of bronchitis   . History of influenza   . Hyperlipidemia   . Hypertension   . Migraines   . Myocardial infarction (HCC) 2005   IMI  . Osteoporosis   . Panic attacks     Past Surgical History:  Procedure Laterality Date  . BREAST LUMPECTOMY WITH RADIOACTIVE SEED LOCALIZATION Right 12/03/2014  . BREAST LUMPECTOMY WITH RADIOACTIVE SEED LOCALIZATION Right 12/03/2014   Procedure: BREAST LUMPECTOMY WITH RADIOACTIVE SEED LOCALIZATION;  Surgeon: Chevis Pretty III, MD;  Location: [facility] OR;  Service: General;  Laterality: Right;  . CARDIAC CATHETERIZATION    . COLONOSCOPY    . CORONARY ARTERY BYPASS GRAFT  2005   LIMA to LAD, SVG to first diagonal, SVG to OM, SVG to RCA and PDA  . HAND SURGERY     Left; trigger finger    Current Outpatient Medications  Medication Sig Dispense Refill  . albuterol (PROVENTIL) (2.5 MG/3ML) 0.083% nebulizer solution Inhale 3 mLs into the lungs 4 (four) times daily as needed.    [name] albuterol (VENTOLIN HFA) 108 (90 Base) MCG/ACT inhaler Inhale 2 puffs into the lungs every 6 (six) hours as needed for wheezing or shortness of breath.    [name] alendronate (FOSAMAX) 70 MG tablet Take 1 tablet by mouth once a week.    . ALPRAZolam (XANAX) 1 MG tablet Take 1 mg by mouth every  4 (four) hours as needed for anxiety (panic attacks). Maximum 5 tablets in one day    . aspirin EC 81 MG tablet Take 81 mg by mouth daily.    . clopidogrel (PLAVIX) 75 MG tablet Take 1 tablet (75 mg total) by mouth daily. 90 tablet 3  . levothyroxine (SYNTHROID) 125 MCG tablet Take 125 mcg by mouth daily.    . meloxicam (MOBIC) 7.5 MG tablet Take 1 tablet by mouth daily.    . metoprolol succinate (TOPROL-XL) 50 MG 24 hr tablet TAKE 1 TABLET ONCE A DAY WITH OR IMMEDIATELY FOLLOWING A MEAL. 90 tablet 1  . mirtazapine (REMERON) 7.5 MG tablet Take 1 tablet by mouth at bedtime.    . nitroGLYCERIN (NITROSTAT) 0.4 MG SL  tablet Place 1 tablet (0.4 mg total) under the tongue every 5 (five) minutes x 3 doses as needed for chest pain (if no relief after 3rd dose, proceed to the ED for an evaluation). 25 tablet 3  . OXYGEN Inhale 2 L into the lungs continuous.     . rosuvastatin (CRESTOR) 20 MG tablet TAKE 1 TABLET BY MOUTH ONCE DAILY. 90 tablet 1  . TRELEGY ELLIPTA 100-62.5-25 MCG/INH AEPB Inhale 1 puff into the lungs daily.    . Vitamin D, Ergocalciferol, (DRISDOL) 1.25 MG (50000 UT) CAPS capsule Take 50,000 Units by mouth 2 (two) times a week.     No current facility-administered medications for this visit.   Allergies:  Diazepam [valium]   Social History: The patient  reports that she has been smoking cigarettes. She started smoking about 50 years ago. She has a 10.75 pack-year smoking history. She has never used smokeless tobacco. She reports that she does not drink alcohol and does not use drugs.   Family History: The patient's family history includes Coronary artery disease in an other family member.   ROS:  Please see the history of present illness. Otherwise, complete review of systems is positive for none.  All other systems are reviewed and negative.   Physical Exam: VS:  BP 132/80   Pulse 95   Ht 5\' 2"  (1.575 m)   Wt 86 lb (39 kg)   SpO2 95%   BMI 15.73 kg/m , BMI Body mass index is 15.73 kg/m.  Wt Readings from Last 3 Encounters:  05/28/20 86 lb (39 kg)  10/28/19 92 lb 3.2 oz (41.8 kg)  05/28/19 91 lb 12.8 oz (41.6 kg)    General: Patient appears comfortable at rest. Neck: Supple, no elevated JVP or carotid bruits, no thyromegaly. Lungs: Diminished throughout.  Mild expiratory wheezing.,  Prolonged expiratory phase., nonlabored breathing at rest. Cardiac: Regular rate and rhythm, no S3 or significant systolic murmur, no pericardial rub. Extremities: No pitting edema, distal pulses 2+. Skin: Warm and dry. Musculoskeletal: No kyphosis. Neuropsychiatric: Alert and oriented x3, affect  grossly appropriate.  ECG:  EKG October 28, 2019 normal sinus rhythm rate of 89.  Nonspecific T wave abnormality.  Recent Labwork: No results found for requested labs within last 8760 hours.     Component Value Date/Time   CHOL 213 (H) 12/02/2010 0921   TRIG 37.0 12/02/2010 0921   HDL 54.40 12/02/2010 0921   CHOLHDL 4 12/02/2010 0921   VLDL 7.4 12/02/2010 0921   LDLCALC 147 04/04/2008 0000   LDLDIRECT 156.2 12/02/2010 0921    Other Studies Reviewed Today:   13/08/2010 Myoview 07/20/2015:  There was no ST segment deviation noted during stress.  The study is normal. There are no perfusion  defects consistent with prior infarct or current ischemia.  This is a low risk study.  The left ventricular ejection fraction is hyperdynamic (>65%).  Assessment and Plan:  1. CAD in native artery   2. Mixed hyperlipidemia   3. COPD, severe (HCC)    1. CAD in native artery Currently denies any anginal symptoms or nitroglycerin use.  Continue aspirin 81 mg daily, Plavix 75 mg daily, Toprol-XL 50 mg daily, sublingual nitroglycerin as needed.  2. Mixed hyperlipidemia Continue Crestor 20 mg daily.   3. COPD, severe (HCC) Continuous O2 by nasal cannula 2 L.  O2 sats 95%.  She has lost around 6 pounds since last follow-up.  She states she briefly ran out of Ensure protein drinks.  Advised increased protein intake for protein calorie malnutrition/end-stage COPD.  Medication Adjustments/Labs and Tests Ordered: Current medicines are reviewed at length with the patient today.  Concerns regarding medicines are outlined above.   Disposition: Follow-up with Dr. Diona Browner or APP 6 months.  Signed, Rennis Harding, NP 05/28/2020 4:24 PM    [facility] at [facility] [address] [place], [place], [place] [address] Phone: [phone]; Fax: [phone]

## 2020-05-28 ENCOUNTER — Ambulatory Visit: Payer: PPO | Admitting: Family Medicine

## 2020-05-28 ENCOUNTER — Encounter: Payer: Self-pay | Admitting: Family Medicine

## 2020-05-28 VITALS — BP 132/80 | HR 95 | Ht 62.0 in | Wt 86.0 lb

## 2020-05-28 DIAGNOSIS — J449 Chronic obstructive pulmonary disease, unspecified: Secondary | ICD-10-CM | POA: Diagnosis not present

## 2020-05-28 DIAGNOSIS — I2510 Atherosclerotic heart disease of native coronary artery without angina pectoris: Secondary | ICD-10-CM

## 2020-05-28 DIAGNOSIS — E782 Mixed hyperlipidemia: Secondary | ICD-10-CM

## 2020-05-28 NOTE — Patient Instructions (Signed)
Medication Instructions:  Continue all current medications.   Labwork: none  Testing/Procedures: none  Follow-Up: 6 months   Any Other Special Instructions Will Be Listed Below (If Applicable).   If you need a refill on your cardiac medications before your next appointment, please call your pharmacy.  

## 2020-06-10 ENCOUNTER — Other Ambulatory Visit: Payer: Self-pay | Admitting: Cardiology

## 2020-07-09 DIAGNOSIS — F419 Anxiety disorder, unspecified: Secondary | ICD-10-CM | POA: Diagnosis not present

## 2020-07-09 DIAGNOSIS — F410 Panic disorder [episodic paroxysmal anxiety] without agoraphobia: Secondary | ICD-10-CM | POA: Diagnosis not present

## 2020-07-23 DIAGNOSIS — E039 Hypothyroidism, unspecified: Secondary | ICD-10-CM | POA: Diagnosis not present

## 2020-07-23 DIAGNOSIS — I10 Essential (primary) hypertension: Secondary | ICD-10-CM | POA: Diagnosis not present

## 2020-08-23 DIAGNOSIS — E039 Hypothyroidism, unspecified: Secondary | ICD-10-CM | POA: Diagnosis not present

## 2020-08-23 DIAGNOSIS — I10 Essential (primary) hypertension: Secondary | ICD-10-CM | POA: Diagnosis not present

## 2020-09-04 DIAGNOSIS — Z79899 Other long term (current) drug therapy: Secondary | ICD-10-CM | POA: Diagnosis not present

## 2020-09-04 DIAGNOSIS — E7800 Pure hypercholesterolemia, unspecified: Secondary | ICD-10-CM | POA: Diagnosis not present

## 2020-09-04 DIAGNOSIS — Z1339 Encounter for screening examination for other mental health and behavioral disorders: Secondary | ICD-10-CM | POA: Diagnosis not present

## 2020-09-04 DIAGNOSIS — Z681 Body mass index (BMI) 19 or less, adult: Secondary | ICD-10-CM | POA: Diagnosis not present

## 2020-09-04 DIAGNOSIS — Z7189 Other specified counseling: Secondary | ICD-10-CM | POA: Diagnosis not present

## 2020-09-04 DIAGNOSIS — I10 Essential (primary) hypertension: Secondary | ICD-10-CM | POA: Diagnosis not present

## 2020-09-04 DIAGNOSIS — Z1331 Encounter for screening for depression: Secondary | ICD-10-CM | POA: Diagnosis not present

## 2020-09-04 DIAGNOSIS — Z299 Encounter for prophylactic measures, unspecified: Secondary | ICD-10-CM | POA: Diagnosis not present

## 2020-09-04 DIAGNOSIS — E039 Hypothyroidism, unspecified: Secondary | ICD-10-CM | POA: Diagnosis not present

## 2020-09-04 DIAGNOSIS — R5383 Other fatigue: Secondary | ICD-10-CM | POA: Diagnosis not present

## 2020-09-04 DIAGNOSIS — F17210 Nicotine dependence, cigarettes, uncomplicated: Secondary | ICD-10-CM | POA: Diagnosis not present

## 2020-09-04 DIAGNOSIS — Z0000 Encounter for general adult medical examination without abnormal findings: Secondary | ICD-10-CM | POA: Diagnosis not present

## 2020-10-08 DIAGNOSIS — F419 Anxiety disorder, unspecified: Secondary | ICD-10-CM | POA: Diagnosis not present

## 2020-10-08 DIAGNOSIS — F410 Panic disorder [episodic paroxysmal anxiety] without agoraphobia: Secondary | ICD-10-CM | POA: Diagnosis not present

## 2020-11-05 ENCOUNTER — Other Ambulatory Visit: Payer: Self-pay | Admitting: Cardiology

## 2020-12-24 DIAGNOSIS — F419 Anxiety disorder, unspecified: Secondary | ICD-10-CM | POA: Diagnosis not present

## 2020-12-24 DIAGNOSIS — F410 Panic disorder [episodic paroxysmal anxiety] without agoraphobia: Secondary | ICD-10-CM | POA: Diagnosis not present

## 2020-12-24 DIAGNOSIS — R404 Transient alteration of awareness: Secondary | ICD-10-CM | POA: Diagnosis not present

## 2021-01-24 DIAGNOSIS — 419620001 Death: Secondary | SNOMED CT | POA: Diagnosis not present

## 2021-01-24 DEATH — deceased

## 2021-01-27 ENCOUNTER — Ambulatory Visit: Payer: PPO | Admitting: Cardiology

## 2021-02-25 IMAGING — MG DIGITAL DIAGNOSTIC BILATERAL MAMMOGRAM WITH TOMO AND CAD
6 of 11 series · 6 of 31 positions shown · non-contrast
Comparison: Previous exam(s).

CLINICAL DATA: Patient underwent a lumpectomy for lobular
neoplasia.Pathology from the lumpectomy revealed lobular neoplasia
including LCIS. No invasive cancer. Patient is and weight loss but
reports no breast lumps.

EXAM:
DIGITAL DIAGNOSTIC BILATERAL MAMMOGRAM WITH CAD AND TOMO

[R CC]
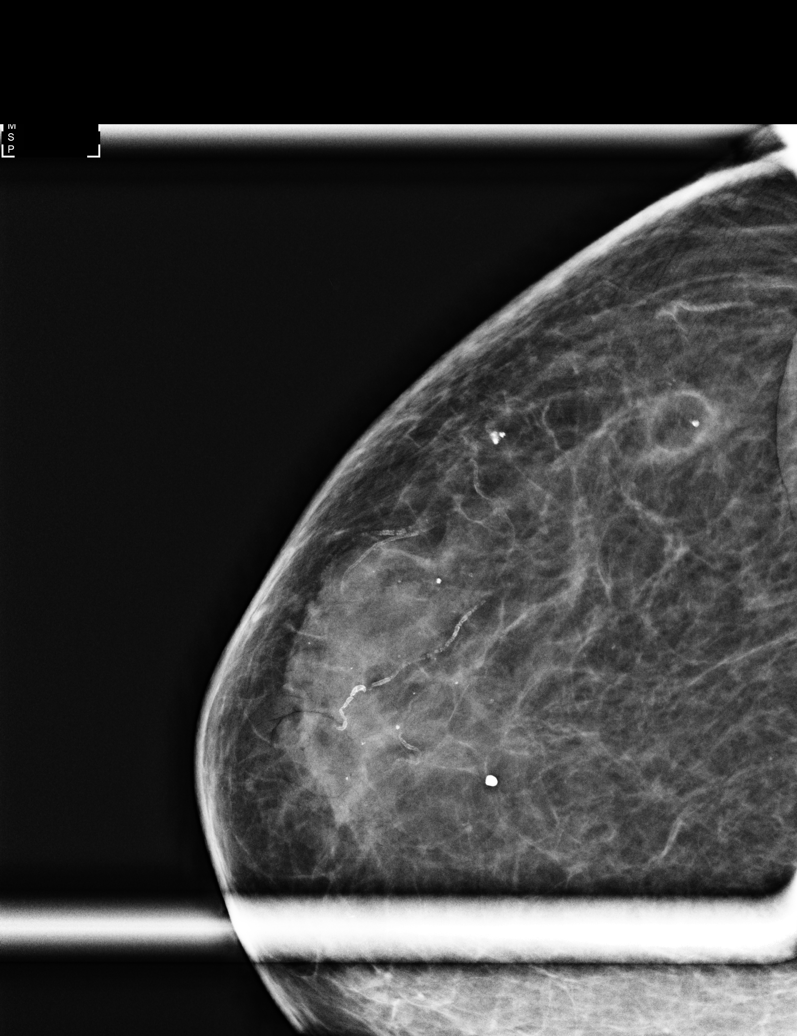

[R CC synth-2D]
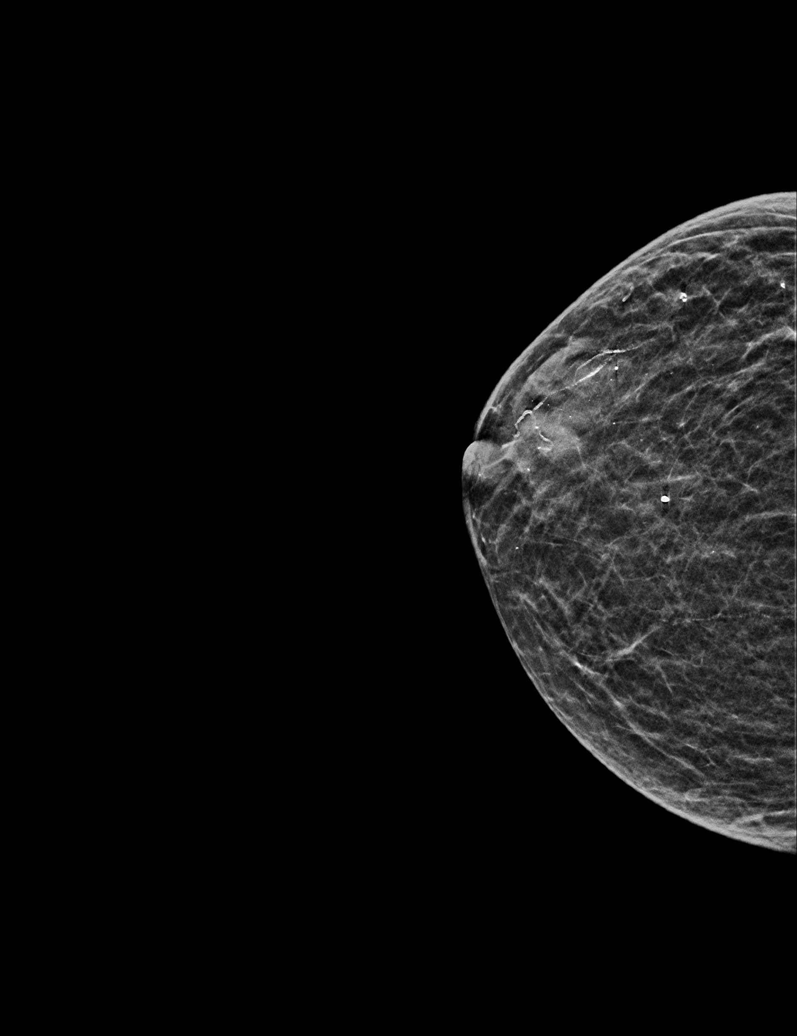

[L MLO synth-2D (1 of 2)]
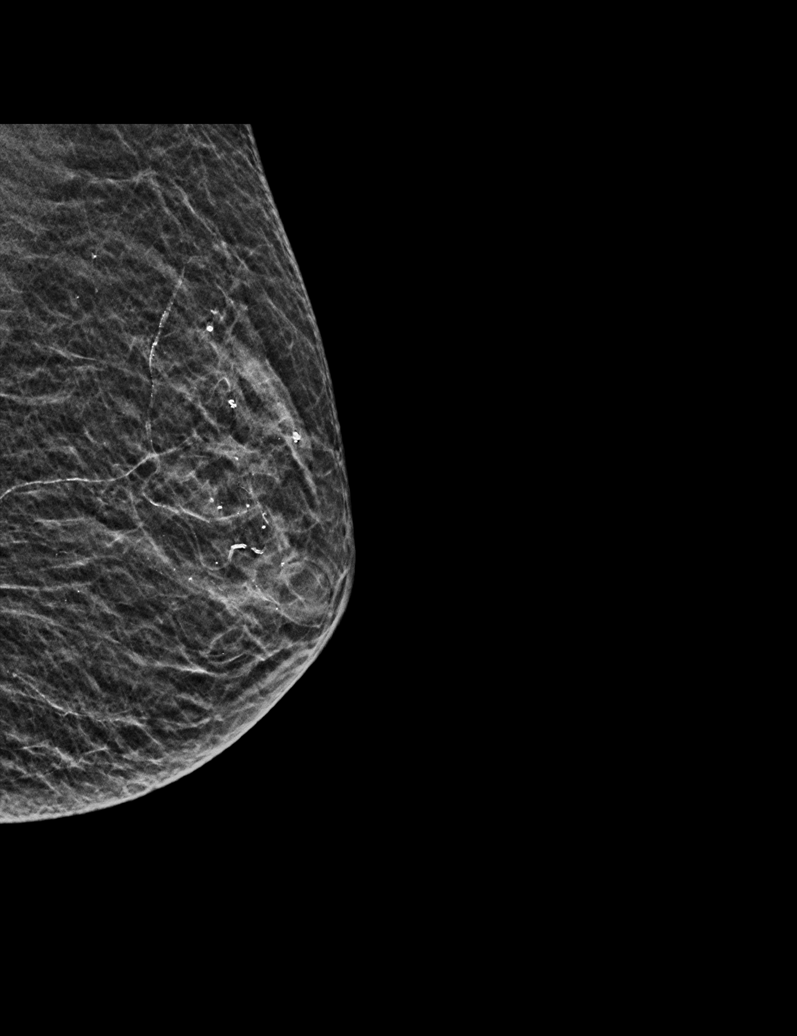

[R MLO synth-2D]
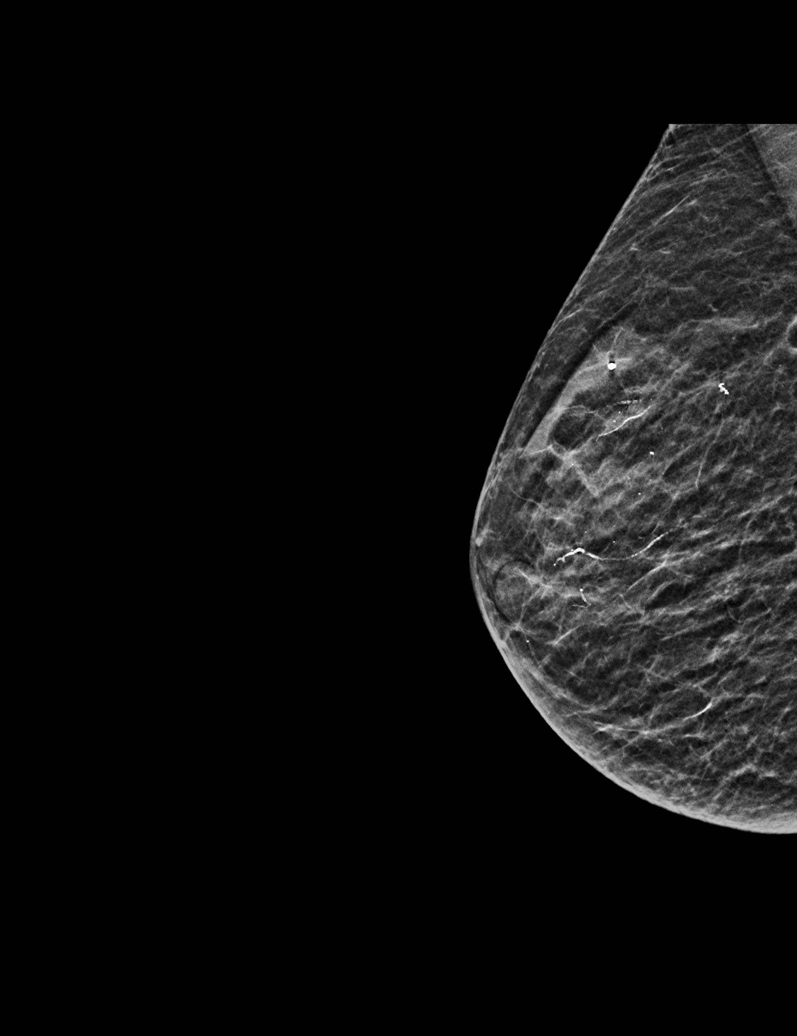

[L MLO synth-2D (2 of 2)]
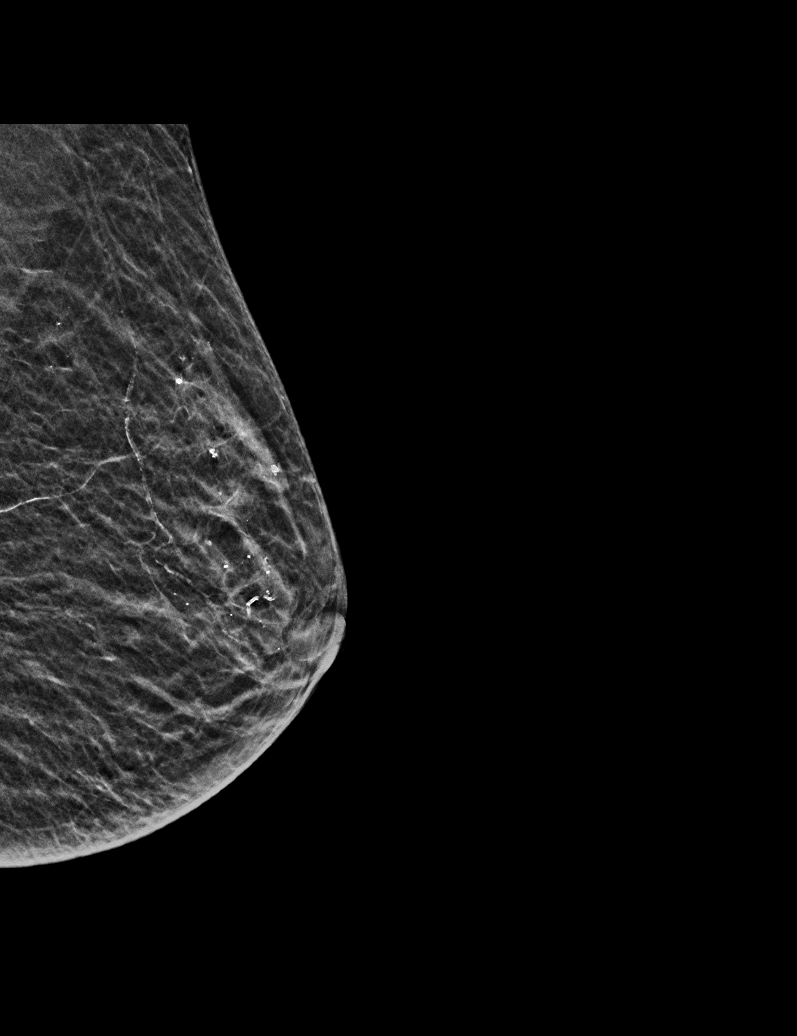

[L CC synth-2D]
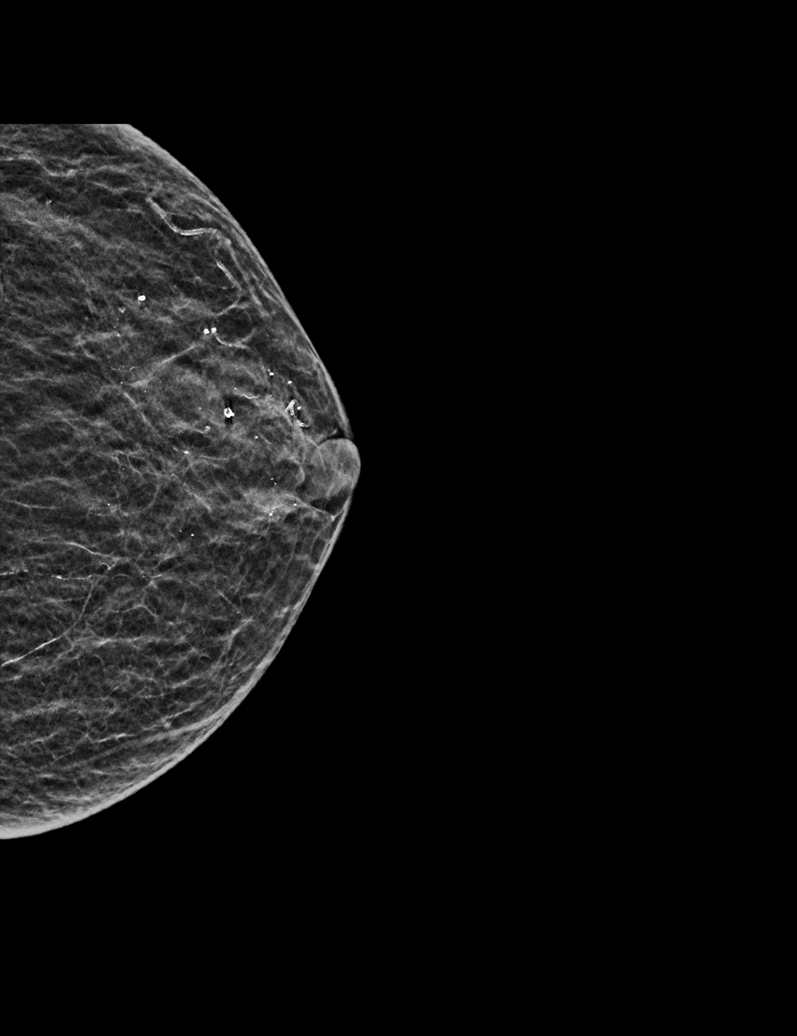

[6 of 31 positions shown; findings below may reference images not displayed]

ACR Breast Density Category b: There are scattered areas of
fibroglandular density.
FINDINGS: There are subtle postsurgical changes in the upper outer right
breast.

There are no masses, areas of nonsurgical architectural distortion,
areas of significant or new asymmetry and no new or suspicious
calcifications.

Mammographic images were processed with CAD.
IMPRESSION: No evidence breast malignancy.

RECOMMENDATION:
Diagnostic mammography in 1 year per standard post lumpectomy
protocol.

I have discussed the findings and recommendations with the patient.
Results were also provided in writing at the conclusion of the
visit. If applicable, a reminder letter will be sent to the patient
regarding the next appointment.

BI-RADS CATEGORY  2: Benign.
